# Patient Record
Sex: Male | Born: 1950 | Race: White | Hispanic: No
Health system: Southern US, Community
[De-identification: ages and names within clinical notes are randomized; demographics above are authoritative.]

---

## 2004-05-22 ENCOUNTER — Other Ambulatory Visit: Payer: Self-pay

## 2004-12-12 ENCOUNTER — Inpatient Hospital Stay: Payer: Self-pay | Admitting: Internal Medicine

## 2005-01-04 ENCOUNTER — Ambulatory Visit: Payer: Self-pay | Admitting: Internal Medicine

## 2005-01-18 ENCOUNTER — Ambulatory Visit: Payer: Self-pay | Admitting: Internal Medicine

## 2005-04-18 ENCOUNTER — Emergency Department: Payer: Self-pay | Admitting: Emergency Medicine

## 2005-09-27 ENCOUNTER — Inpatient Hospital Stay: Payer: Self-pay | Admitting: Internal Medicine

## 2006-06-05 ENCOUNTER — Emergency Department: Payer: Self-pay | Admitting: General Practice

## 2006-09-26 ENCOUNTER — Ambulatory Visit: Payer: Self-pay | Admitting: Urology

## 2007-08-21 ENCOUNTER — Ambulatory Visit: Payer: Self-pay | Admitting: Urology

## 2007-08-28 ENCOUNTER — Emergency Department: Payer: Self-pay | Admitting: Emergency Medicine

## 2007-09-23 ENCOUNTER — Other Ambulatory Visit: Payer: Self-pay

## 2007-09-23 ENCOUNTER — Emergency Department: Payer: Self-pay | Admitting: Emergency Medicine

## 2007-09-24 ENCOUNTER — Other Ambulatory Visit: Payer: Self-pay

## 2007-11-24 ENCOUNTER — Ambulatory Visit: Payer: Self-pay | Admitting: Cardiovascular Disease

## 2012-01-04 ENCOUNTER — Inpatient Hospital Stay: Payer: Self-pay | Admitting: Student

## 2012-01-04 LAB — COMPREHENSIVE METABOLIC PANEL
Albumin: 3.4 g/dL (ref 3.4–5.0)
Alkaline Phosphatase: 120 U/L (ref 50–136)
BUN: 21 mg/dL — ABNORMAL HIGH (ref 7–18)
Bilirubin,Total: 0.3 mg/dL (ref 0.2–1.0)
EGFR (African American): 46 — ABNORMAL LOW
EGFR (Non-African Amer.): 39 — ABNORMAL LOW
Osmolality: 286 (ref 275–301)
SGPT (ALT): 12 U/L
Sodium: 139 mmol/L (ref 136–145)
Total Protein: 7.7 g/dL (ref 6.4–8.2)

## 2012-01-04 LAB — CBC
HGB: 11.8 g/dL — ABNORMAL LOW (ref 13.0–18.0)
MCV: 96 fL (ref 80–100)
Platelet: 242 10*3/uL (ref 150–440)
RBC: 3.72 10*6/uL — ABNORMAL LOW (ref 4.40–5.90)
RDW: 12.9 % (ref 11.5–14.5)
WBC: 9.2 10*3/uL (ref 3.8–10.6)

## 2012-01-04 LAB — URINALYSIS, COMPLETE
Glucose,UR: NEGATIVE mg/dL (ref 0–75)
Protein: 30
RBC,UR: 164 /HPF (ref 0–5)
Specific Gravity: 1.009 (ref 1.003–1.030)
Squamous Epithelial: NONE SEEN

## 2012-01-05 LAB — CBC WITH DIFFERENTIAL/PLATELET
Basophil %: 0.4 %
Eosinophil #: 0.2 10*3/uL (ref 0.0–0.7)
HCT: 34.6 % — ABNORMAL LOW (ref 40.0–52.0)
HGB: 11.3 g/dL — ABNORMAL LOW (ref 13.0–18.0)
Lymphocyte #: 2.2 10*3/uL (ref 1.0–3.6)
MCH: 31.2 pg (ref 26.0–34.0)
Monocyte %: 7.3 %
Platelet: 214 10*3/uL (ref 150–440)
RDW: 12.8 % (ref 11.5–14.5)
WBC: 17.8 10*3/uL — ABNORMAL HIGH (ref 3.8–10.6)

## 2012-01-05 LAB — BASIC METABOLIC PANEL
Anion Gap: 12 (ref 7–16)
Calcium, Total: 8.7 mg/dL (ref 8.5–10.1)
Chloride: 106 mmol/L (ref 98–107)
Creatinine: 1.77 mg/dL — ABNORMAL HIGH (ref 0.60–1.30)
EGFR (African American): 47 — ABNORMAL LOW
EGFR (Non-African Amer.): 41 — ABNORMAL LOW
Glucose: 204 mg/dL — ABNORMAL HIGH (ref 65–99)
Potassium: 4.9 mmol/L (ref 3.5–5.1)

## 2012-01-05 LAB — TROPONIN I
Troponin-I: <0.02 ng/mL
Troponin-I: <0.02 ng/mL

## 2012-01-05 LAB — CK TOTAL AND CKMB (NOT AT ARMC)
CK, Total: 134 U/L (ref 35–232)
CK, Total: 580 U/L — ABNORMAL HIGH (ref 35–232)
CK-MB: 3.9 ng/mL — ABNORMAL HIGH (ref 0.5–3.6)
CK-MB: 3 ng/mL (ref 0.5–3.6)

## 2012-01-05 LAB — MAGNESIUM: Magnesium: 1.6 mg/dL — ABNORMAL LOW

## 2012-01-06 LAB — CBC WITH DIFFERENTIAL/PLATELET
Basophil %: 0.6 %
Eosinophil %: 7.6 %
HCT: 31.7 % — ABNORMAL LOW (ref 40.0–52.0)
HGB: 10.5 g/dL — ABNORMAL LOW (ref 13.0–18.0)
Lymphocyte %: 25.2 %
MCH: 31.2 pg (ref 26.0–34.0)
MCV: 94 fL (ref 80–100)
Monocyte %: 8.8 %
Neutrophil %: 57.8 %
Platelet: 242 10*3/uL (ref 150–440)
RBC: 3.38 10*6/uL — ABNORMAL LOW (ref 4.40–5.90)
RDW: 12.9 % (ref 11.5–14.5)
WBC: 10.9 10*3/uL — ABNORMAL HIGH (ref 3.8–10.6)

## 2012-01-06 LAB — BASIC METABOLIC PANEL
Anion Gap: 11 (ref 7–16)
BUN: 15 mg/dL (ref 7–18)
Calcium, Total: 8.2 mg/dL — ABNORMAL LOW (ref 8.5–10.1)
Chloride: 108 mmol/L — ABNORMAL HIGH (ref 98–107)
Creatinine: 1.31 mg/dL — ABNORMAL HIGH (ref 0.60–1.30)
EGFR (African American): >60
Glucose: 153 mg/dL — ABNORMAL HIGH (ref 65–99)
Osmolality: 283 (ref 275–301)
Potassium: 4.2 mmol/L (ref 3.5–5.1)
Sodium: 140 mmol/L (ref 136–145)

## 2012-01-06 LAB — CK: CK, Total: 417 U/L — ABNORMAL HIGH (ref 35–232)

## 2012-01-07 LAB — URINE CULTURE

## 2012-01-08 LAB — BASIC METABOLIC PANEL
BUN: 14 mg/dL (ref 7–18)
Chloride: 111 mmol/L — ABNORMAL HIGH (ref 98–107)
Co2: 25 mmol/L (ref 21–32)
Creatinine: 1.36 mg/dL — ABNORMAL HIGH (ref 0.60–1.30)
EGFR (Non-African Amer.): 56 — ABNORMAL LOW
Glucose: 126 mg/dL — ABNORMAL HIGH (ref 65–99)
Osmolality: 293 (ref 275–301)
Sodium: 146 mmol/L — ABNORMAL HIGH (ref 136–145)

## 2012-01-10 LAB — CULTURE, BLOOD (SINGLE)

## 2012-02-28 ENCOUNTER — Emergency Department: Payer: Self-pay | Admitting: Emergency Medicine

## 2012-02-28 LAB — COMPREHENSIVE METABOLIC PANEL
Alkaline Phosphatase: 149 U/L — ABNORMAL HIGH (ref 50–136)
Anion Gap: 10 (ref 7–16)
Calcium, Total: 9.3 mg/dL (ref 8.5–10.1)
Chloride: 101 mmol/L (ref 98–107)
Co2: 24 mmol/L (ref 21–32)
Creatinine: 1.49 mg/dL — ABNORMAL HIGH (ref 0.60–1.30)
EGFR (African American): 58 — ABNORMAL LOW
Osmolality: 284 (ref 275–301)
Potassium: 4.3 mmol/L (ref 3.5–5.1)
SGOT(AST): 14 U/L — ABNORMAL LOW (ref 15–37)
SGPT (ALT): 15 U/L
Sodium: 135 mmol/L — ABNORMAL LOW (ref 136–145)
Total Protein: 8.6 g/dL — ABNORMAL HIGH (ref 6.4–8.2)

## 2012-02-28 LAB — CBC
HGB: 12 g/dL — ABNORMAL LOW (ref 13.0–18.0)
MCH: 31.1 pg (ref 26.0–34.0)
MCV: 94 fL (ref 80–100)
Platelet: 295 10*3/uL (ref 150–440)
RDW: 13.2 % (ref 11.5–14.5)

## 2012-06-12 ENCOUNTER — Inpatient Hospital Stay: Payer: Self-pay | Admitting: Internal Medicine

## 2012-06-12 LAB — URINALYSIS, COMPLETE
Leukocyte Esterase: NEGATIVE
Nitrite: NEGATIVE
Ph: 6 (ref 4.5–8.0)
Protein: 30
RBC,UR: 267 /HPF (ref 0–5)
Specific Gravity: 1.015 (ref 1.003–1.030)
Squamous Epithelial: NONE SEEN

## 2012-06-12 LAB — CBC
HCT: 35.1 % — ABNORMAL LOW (ref 40.0–52.0)
HGB: 11.9 g/dL — ABNORMAL LOW (ref 13.0–18.0)
MCH: 30.8 pg (ref 26.0–34.0)
MCV: 91 fL (ref 80–100)
Platelet: 234 10*3/uL (ref 150–440)
RBC: 3.86 10*6/uL — ABNORMAL LOW (ref 4.40–5.90)
RDW: 12.7 % (ref 11.5–14.5)
WBC: 15.3 10*3/uL — ABNORMAL HIGH (ref 3.8–10.6)

## 2012-06-12 LAB — COMPREHENSIVE METABOLIC PANEL
Alkaline Phosphatase: 141 U/L — ABNORMAL HIGH (ref 50–136)
Anion Gap: 12 (ref 7–16)
Bilirubin,Total: 0.5 mg/dL (ref 0.2–1.0)
Calcium, Total: 8.7 mg/dL (ref 8.5–10.1)
Co2: 22 mmol/L (ref 21–32)
Creatinine: 2.14 mg/dL — ABNORMAL HIGH (ref 0.60–1.30)
EGFR (Non-African Amer.): 32 — ABNORMAL LOW
Glucose: 512 mg/dL (ref 65–99)
Osmolality: 306 (ref 275–301)
Potassium: 4.3 mmol/L (ref 3.5–5.1)
SGPT (ALT): 13 U/L (ref 12–78)
Sodium: 138 mmol/L (ref 136–145)
Total Protein: 7.5 g/dL (ref 6.4–8.2)

## 2012-06-12 LAB — SEDIMENTATION RATE: Erythrocyte Sed Rate: 65 mm/hr — ABNORMAL HIGH (ref 0–20)

## 2012-06-13 LAB — CBC WITH DIFFERENTIAL/PLATELET
Basophil #: 0.1 10*3/uL (ref 0.0–0.1)
Basophil %: 0.8 %
Eosinophil #: 0.1 10*3/uL (ref 0.0–0.7)
Eosinophil %: 0.5 %
HCT: 28.5 % — ABNORMAL LOW (ref 40.0–52.0)
HGB: 10 g/dL — ABNORMAL LOW (ref 13.0–18.0)
Lymphocyte #: 0.4 10*3/uL — ABNORMAL LOW (ref 1.0–3.6)
Lymphocyte %: 4.1 %
MCHC: 35.1 g/dL (ref 32.0–36.0)
MCV: 89 fL (ref 80–100)
Neutrophil #: 8.8 10*3/uL — ABNORMAL HIGH (ref 1.4–6.5)
Neutrophil %: 90.4 %
RDW: 12.7 % (ref 11.5–14.5)
WBC: 9.8 10*3/uL (ref 3.8–10.6)

## 2012-06-13 LAB — BASIC METABOLIC PANEL
Anion Gap: 9 (ref 7–16)
Calcium, Total: 7.6 mg/dL — ABNORMAL LOW (ref 8.5–10.1)
Creatinine: 1.85 mg/dL — ABNORMAL HIGH (ref 0.60–1.30)
EGFR (African American): 45 — ABNORMAL LOW
EGFR (Non-African Amer.): 39 — ABNORMAL LOW
Glucose: 93 mg/dL (ref 65–99)
Potassium: 3.9 mmol/L (ref 3.5–5.1)
Sodium: 138 mmol/L (ref 136–145)

## 2012-06-13 LAB — URINE CULTURE

## 2012-06-14 LAB — CBC WITH DIFFERENTIAL/PLATELET
Basophil #: 0.1 10*3/uL (ref 0.0–0.1)
Basophil %: 0.9 %
Eosinophil #: 0.6 10*3/uL (ref 0.0–0.7)
Eosinophil %: 9.8 %
HGB: 9.6 g/dL — ABNORMAL LOW (ref 13.0–18.0)
MCH: 30.4 pg (ref 26.0–34.0)
MCV: 90 fL (ref 80–100)
Monocyte #: 0.5 x10 3/mm (ref 0.2–1.0)
Monocyte %: 8.3 %
Neutrophil #: 3.6 10*3/uL (ref 1.4–6.5)
Neutrophil %: 57.7 %
RBC: 3.16 10*6/uL — ABNORMAL LOW (ref 4.40–5.90)

## 2012-06-15 LAB — CREATININE, SERUM
Creatinine: 1.99 mg/dL — ABNORMAL HIGH (ref 0.60–1.30)
EGFR (African American): 41 — ABNORMAL LOW
EGFR (Non-African Amer.): 35 — ABNORMAL LOW

## 2012-06-15 LAB — WBC: WBC: 6.8 10*3/uL (ref 3.8–10.6)

## 2012-06-16 LAB — CULTURE, BLOOD (SINGLE)

## 2012-06-16 LAB — VANCOMYCIN, TROUGH: Vancomycin, Trough: 26 ug/mL (ref 10–20)

## 2012-06-19 LAB — PATHOLOGY REPORT

## 2012-06-20 LAB — CULTURE, BLOOD (SINGLE)

## 2012-11-19 ENCOUNTER — Ambulatory Visit: Payer: Self-pay | Admitting: Family Medicine

## 2012-11-19 LAB — URINALYSIS, COMPLETE
Bilirubin,UR: NEGATIVE
Specific Gravity: 1.01 (ref 1.003–1.030)

## 2012-11-26 LAB — URINE CULTURE

## 2013-01-14 ENCOUNTER — Inpatient Hospital Stay: Payer: Self-pay | Admitting: Student

## 2013-01-14 LAB — CBC
HCT: 37.4 % — ABNORMAL LOW (ref 40.0–52.0)
HGB: 12.9 g/dL — ABNORMAL LOW (ref 13.0–18.0)
MCHC: 34.6 g/dL (ref 32.0–36.0)
Platelet: 277 10*3/uL (ref 150–440)
RBC: 4.18 10*6/uL — ABNORMAL LOW (ref 4.40–5.90)
RDW: 12.5 % (ref 11.5–14.5)
WBC: 12 10*3/uL — ABNORMAL HIGH (ref 3.8–10.6)

## 2013-01-14 LAB — COMPREHENSIVE METABOLIC PANEL
Alkaline Phosphatase: 170 U/L — ABNORMAL HIGH (ref 50–136)
Anion Gap: 9 (ref 7–16)
BUN: 27 mg/dL — ABNORMAL HIGH (ref 7–18)
Calcium, Total: 8.9 mg/dL (ref 8.5–10.1)
Creatinine: 2.08 mg/dL — ABNORMAL HIGH (ref 0.60–1.30)
Glucose: 357 mg/dL — ABNORMAL HIGH (ref 65–99)
SGOT(AST): 12 U/L — ABNORMAL LOW (ref 15–37)
Sodium: 131 mmol/L — ABNORMAL LOW (ref 136–145)
Total Protein: 8.1 g/dL (ref 6.4–8.2)

## 2013-01-14 LAB — URINALYSIS, COMPLETE
Bilirubin,UR: NEGATIVE
Glucose,UR: >=500 mg/dL (ref 0–75)
Nitrite: POSITIVE
Protein: 30
RBC,UR: 12 /HPF (ref 0–5)
Specific Gravity: 1.016 (ref 1.003–1.030)
Squamous Epithelial: NONE SEEN
WBC UR: 64 /HPF (ref 0–5)

## 2013-01-14 LAB — HEMOGLOBIN A1C: Hemoglobin A1C: 11.5 % — ABNORMAL HIGH (ref 4.2–6.3)

## 2013-01-14 LAB — LIPASE, BLOOD: Lipase: 55 U/L — ABNORMAL LOW (ref 73–393)

## 2013-01-15 LAB — PHOSPHORUS: Phosphorus: 1.9 mg/dL — ABNORMAL LOW (ref 2.5–4.9)

## 2013-01-15 LAB — BASIC METABOLIC PANEL
Anion Gap: 8 (ref 7–16)
BUN: 26 mg/dL — ABNORMAL HIGH (ref 7–18)
Calcium, Total: 8.3 mg/dL — ABNORMAL LOW (ref 8.5–10.1)
Chloride: 102 mmol/L (ref 98–107)
Creatinine: 1.83 mg/dL — ABNORMAL HIGH (ref 0.60–1.30)
Potassium: 3.3 mmol/L — ABNORMAL LOW (ref 3.5–5.1)

## 2013-01-15 LAB — IRON AND TIBC
Iron Bind.Cap.(Total): 239 ug/dL — ABNORMAL LOW (ref 250–450)
Iron Saturation: 15 %
Iron: 36 ug/dL — ABNORMAL LOW (ref 65–175)
Unbound Iron-Bind.Cap.: 203 ug/dL

## 2013-01-15 LAB — CBC WITH DIFFERENTIAL/PLATELET
Basophil #: 0.1 10*3/uL (ref 0.0–0.1)
Basophil %: 0.9 %
Eosinophil #: 0.4 10*3/uL (ref 0.0–0.7)
Eosinophil %: 4.2 %
MCHC: 36 g/dL (ref 32.0–36.0)
Monocyte #: 0.8 x10 3/mm (ref 0.2–1.0)
Monocyte %: 8.8 %
Neutrophil %: 58.8 %
Platelet: 239 10*3/uL (ref 150–440)
RBC: 3.7 10*6/uL — ABNORMAL LOW (ref 4.40–5.90)
RDW: 12.2 % (ref 11.5–14.5)
WBC: 9.3 10*3/uL (ref 3.8–10.6)

## 2013-01-15 LAB — PROTEIN / CREATININE RATIO, URINE
Creatinine, Urine: 127.3 mg/dL — ABNORMAL HIGH (ref 30.0–125.0)
Protein/Creat. Ratio: 958 mg/gCREAT — ABNORMAL HIGH (ref 0–200)

## 2013-01-15 LAB — FERRITIN: Ferritin (ARMC): 85 ng/mL (ref 8–388)

## 2013-01-16 LAB — BASIC METABOLIC PANEL
BUN: 19 mg/dL — ABNORMAL HIGH (ref 7–18)
Calcium, Total: 8.1 mg/dL — ABNORMAL LOW (ref 8.5–10.1)
Co2: 31 mmol/L (ref 21–32)
Creatinine: 1.81 mg/dL — ABNORMAL HIGH (ref 0.60–1.30)
EGFR (African American): 46 — ABNORMAL LOW
EGFR (Non-African Amer.): 39 — ABNORMAL LOW
Glucose: 139 mg/dL — ABNORMAL HIGH (ref 65–99)
Osmolality: 275 (ref 275–301)

## 2013-01-17 LAB — BASIC METABOLIC PANEL
Chloride: 106 mmol/L (ref 98–107)
Co2: 28 mmol/L (ref 21–32)
EGFR (African American): 59 — ABNORMAL LOW
EGFR (Non-African Amer.): 51 — ABNORMAL LOW
Glucose: 78 mg/dL (ref 65–99)
Potassium: 4.2 mmol/L (ref 3.5–5.1)

## 2013-01-18 LAB — BASIC METABOLIC PANEL
Anion Gap: 3 — ABNORMAL LOW (ref 7–16)
BUN: 18 mg/dL (ref 7–18)
Calcium, Total: 8.4 mg/dL — ABNORMAL LOW (ref 8.5–10.1)
Chloride: 106 mmol/L (ref 98–107)
Co2: 30 mmol/L (ref 21–32)
Creatinine: 1.61 mg/dL — ABNORMAL HIGH (ref 0.60–1.30)
EGFR (African American): 53 — ABNORMAL LOW
Glucose: 244 mg/dL — ABNORMAL HIGH (ref 65–99)
Osmolality: 288 (ref 275–301)
Potassium: 4.3 mmol/L (ref 3.5–5.1)
Sodium: 139 mmol/L (ref 136–145)

## 2013-01-18 LAB — PATHOLOGY REPORT

## 2013-01-19 ENCOUNTER — Ambulatory Visit: Payer: Self-pay | Admitting: Family Medicine

## 2013-01-19 LAB — CBC WITH DIFFERENTIAL/PLATELET
Basophil #: 0.1 10*3/uL (ref 0.0–0.1)
Basophil %: 0.9 %
Eosinophil %: 5.3 %
HCT: 35.6 % — ABNORMAL LOW (ref 40.0–52.0)
HGB: 12 g/dL — ABNORMAL LOW (ref 13.0–18.0)
Lymphocyte #: 2.1 10*3/uL (ref 1.0–3.6)
Monocyte %: 5.8 %
Neutrophil %: 65.3 %
Platelet: 284 10*3/uL (ref 150–440)
RBC: 3.94 10*6/uL — ABNORMAL LOW (ref 4.40–5.90)
WBC: 9.3 10*3/uL (ref 3.8–10.6)

## 2013-01-19 LAB — BASIC METABOLIC PANEL
BUN: 15 mg/dL (ref 7–18)
Chloride: 97 mmol/L — ABNORMAL LOW (ref 98–107)
Co2: 27 mmol/L (ref 21–32)
EGFR (African American): 44 — ABNORMAL LOW
EGFR (Non-African Amer.): 38 — ABNORMAL LOW
Potassium: 4.9 mmol/L (ref 3.5–5.1)

## 2013-01-20 LAB — CULTURE, BLOOD (SINGLE)

## 2013-03-21 ENCOUNTER — Ambulatory Visit: Payer: Self-pay | Admitting: Family Medicine

## 2013-03-21 ENCOUNTER — Inpatient Hospital Stay: Payer: Self-pay | Admitting: Internal Medicine

## 2013-03-21 LAB — CBC WITH DIFFERENTIAL/PLATELET
Basophil #: 0.1 10*3/uL (ref 0.0–0.1)
Eosinophil #: 0.1 10*3/uL (ref 0.0–0.7)
Eosinophil %: 0.9 %
HGB: 12.7 g/dL — ABNORMAL LOW (ref 13.0–18.0)
MCH: 29.9 pg (ref 26.0–34.0)
MCHC: 33.8 g/dL (ref 32.0–36.0)
MCV: 89 fL (ref 80–100)
Monocyte #: 0.9 x10 3/mm (ref 0.2–1.0)
Neutrophil #: 10.7 10*3/uL — ABNORMAL HIGH (ref 1.4–6.5)
Neutrophil %: 76.3 %
Platelet: 231 10*3/uL (ref 150–440)
RBC: 4.26 10*6/uL — ABNORMAL LOW (ref 4.40–5.90)
RDW: 13.1 % (ref 11.5–14.5)

## 2013-03-21 LAB — URINALYSIS, COMPLETE
Glucose,UR: 1000 mg/dL (ref 0–75)
Nitrite: POSITIVE
Ph: 7 (ref 4.5–8.0)
Protein: 30

## 2013-03-21 LAB — COMPREHENSIVE METABOLIC PANEL
Albumin: 3.1 g/dL — ABNORMAL LOW (ref 3.4–5.0)
Alkaline Phosphatase: 174 U/L — ABNORMAL HIGH (ref 50–136)
Anion Gap: 7 (ref 7–16)
BUN: 17 mg/dL (ref 7–18)
Calcium, Total: 9 mg/dL (ref 8.5–10.1)
Chloride: 102 mmol/L (ref 98–107)
Creatinine: 2.01 mg/dL — ABNORMAL HIGH (ref 0.60–1.30)
EGFR (Non-African Amer.): 35 — ABNORMAL LOW
Osmolality: 283 (ref 275–301)
SGOT(AST): 10 U/L — ABNORMAL LOW (ref 15–37)
SGPT (ALT): 13 U/L (ref 12–78)
Sodium: 137 mmol/L (ref 136–145)
Total Protein: 7.8 g/dL (ref 6.4–8.2)

## 2013-03-22 LAB — CBC WITH DIFFERENTIAL/PLATELET
Basophil #: 0 10*3/uL (ref 0.0–0.1)
Basophil %: 0.3 %
Eosinophil #: 0 10*3/uL (ref 0.0–0.7)
Eosinophil %: 0.3 %
HCT: 41 % (ref 40.0–52.0)
Lymphocyte #: 1.4 10*3/uL (ref 1.0–3.6)
Lymphocyte %: 9.9 %
MCHC: 33.4 g/dL (ref 32.0–36.0)
MCV: 91 fL (ref 80–100)
Monocyte %: 5.5 %
Neutrophil #: 12.1 10*3/uL — ABNORMAL HIGH (ref 1.4–6.5)
Neutrophil %: 84 %
Platelet: 279 10*3/uL (ref 150–440)
RBC: 4.53 10*6/uL (ref 4.40–5.90)
RDW: 13.1 % (ref 11.5–14.5)
WBC: 14.5 10*3/uL — ABNORMAL HIGH (ref 3.8–10.6)

## 2013-03-22 LAB — BASIC METABOLIC PANEL
Anion Gap: 16 (ref 7–16)
BUN: 21 mg/dL — ABNORMAL HIGH (ref 7–18)
Calcium, Total: 9.6 mg/dL (ref 8.5–10.1)
Creatinine: 2.39 mg/dL — ABNORMAL HIGH (ref 0.60–1.30)
EGFR (Non-African Amer.): 28 — ABNORMAL LOW
Osmolality: 286 (ref 275–301)
Sodium: 140 mmol/L (ref 136–145)

## 2013-03-22 LAB — MAGNESIUM: Magnesium: 1.6 mg/dL — ABNORMAL LOW

## 2013-03-23 LAB — BASIC METABOLIC PANEL
BUN: 24 mg/dL — ABNORMAL HIGH (ref 7–18)
Creatinine: 2.17 mg/dL — ABNORMAL HIGH (ref 0.60–1.30)
Glucose: 104 mg/dL — ABNORMAL HIGH (ref 65–99)

## 2013-03-23 LAB — URINE CULTURE

## 2013-03-27 LAB — CBC WITH DIFFERENTIAL/PLATELET
Basophil #: 0.1 10*3/uL (ref 0.0–0.1)
Eosinophil %: 8.6 %
HCT: 32.4 % — ABNORMAL LOW (ref 40.0–52.0)
Lymphocyte #: 1.9 10*3/uL (ref 1.0–3.6)
Lymphocyte %: 21.4 %
MCH: 31.1 pg (ref 26.0–34.0)
MCHC: 34.7 g/dL (ref 32.0–36.0)
Monocyte #: 0.9 x10 3/mm (ref 0.2–1.0)
Monocyte %: 10.6 %
Neutrophil #: 5.2 10*3/uL (ref 1.4–6.5)
Neutrophil %: 58.5 %
Platelet: 255 10*3/uL (ref 150–440)
RDW: 12.5 % (ref 11.5–14.5)
WBC: 8.8 10*3/uL (ref 3.8–10.6)

## 2013-03-27 LAB — BASIC METABOLIC PANEL
BUN: 17 mg/dL (ref 7–18)
Chloride: 104 mmol/L (ref 98–107)
Creatinine: 1.46 mg/dL — ABNORMAL HIGH (ref 0.60–1.30)
EGFR (African American): 59 — ABNORMAL LOW
Osmolality: 280 (ref 275–301)
Potassium: 4 mmol/L (ref 3.5–5.1)

## 2013-03-27 LAB — HEMOGLOBIN A1C: Hemoglobin A1C: 10.1 % — ABNORMAL HIGH (ref 4.2–6.3)

## 2013-04-05 ENCOUNTER — Observation Stay: Payer: Self-pay | Admitting: Internal Medicine

## 2013-04-05 LAB — COMPREHENSIVE METABOLIC PANEL
Albumin: 3.2 g/dL — ABNORMAL LOW (ref 3.4–5.0)
Alkaline Phosphatase: 129 U/L (ref 50–136)
Bilirubin,Total: 0.3 mg/dL (ref 0.2–1.0)
Calcium, Total: 8.9 mg/dL (ref 8.5–10.1)
Chloride: 98 mmol/L (ref 98–107)
Co2: 32 mmol/L (ref 21–32)
Creatinine: 1.86 mg/dL — ABNORMAL HIGH (ref 0.60–1.30)
EGFR (Non-African Amer.): 38 — ABNORMAL LOW
Glucose: 34 mg/dL — CL (ref 65–99)
Osmolality: 270 (ref 275–301)
SGOT(AST): 10 U/L — ABNORMAL LOW (ref 15–37)
SGPT (ALT): 12 U/L (ref 12–78)
Sodium: 134 mmol/L — ABNORMAL LOW (ref 136–145)
Total Protein: 8 g/dL (ref 6.4–8.2)

## 2013-04-05 LAB — URINALYSIS, COMPLETE
Bilirubin,UR: NEGATIVE
Glucose,UR: >=500 mg/dL (ref 0–75)
Ketone: NEGATIVE
Ph: 6 (ref 4.5–8.0)
RBC,UR: 3 /HPF (ref 0–5)
Specific Gravity: 1.008 (ref 1.003–1.030)
Squamous Epithelial: NONE SEEN

## 2013-04-05 LAB — CBC
HCT: 36 % — ABNORMAL LOW (ref 40.0–52.0)
Platelet: 419 10*3/uL (ref 150–440)
RBC: 4.06 10*6/uL — ABNORMAL LOW (ref 4.40–5.90)
RDW: 12.8 % (ref 11.5–14.5)

## 2013-04-06 LAB — COMPREHENSIVE METABOLIC PANEL
Albumin: 2.8 g/dL — ABNORMAL LOW (ref 3.4–5.0)
Alkaline Phosphatase: 139 U/L — ABNORMAL HIGH (ref 50–136)
Anion Gap: 7 (ref 7–16)
Bilirubin,Total: 0.3 mg/dL (ref 0.2–1.0)
Chloride: 99 mmol/L (ref 98–107)
Creatinine: 1.68 mg/dL — ABNORMAL HIGH (ref 0.60–1.30)
Osmolality: 283 (ref 275–301)
SGOT(AST): 9 U/L — ABNORMAL LOW (ref 15–37)
SGPT (ALT): 10 U/L — ABNORMAL LOW (ref 12–78)
Total Protein: 7.3 g/dL (ref 6.4–8.2)

## 2013-04-06 LAB — CBC WITH DIFFERENTIAL/PLATELET
Eosinophil #: 0.5 10*3/uL (ref 0.0–0.7)
Eosinophil %: 4.4 %
HCT: 34 % — ABNORMAL LOW (ref 40.0–52.0)
Lymphocyte %: 22.4 %
Neutrophil #: 7.9 10*3/uL — ABNORMAL HIGH (ref 1.4–6.5)
Platelet: 371 10*3/uL (ref 150–440)
RBC: 3.83 10*6/uL — ABNORMAL LOW (ref 4.40–5.90)
WBC: 11.8 10*3/uL — ABNORMAL HIGH (ref 3.8–10.6)

## 2013-04-06 LAB — MAGNESIUM: Magnesium: 2 mg/dL

## 2013-04-07 LAB — WBC: WBC: 7.1 10*3/uL (ref 3.8–10.6)

## 2013-04-10 LAB — CULTURE, BLOOD (SINGLE)

## 2013-04-21 ENCOUNTER — Emergency Department: Payer: Self-pay | Admitting: Emergency Medicine

## 2013-04-21 LAB — URINALYSIS, COMPLETE
Glucose,UR: 50 mg/dL (ref 0–75)
Hyaline Cast: 4
Ketone: NEGATIVE
Nitrite: NEGATIVE
Ph: 5 (ref 4.5–8.0)
RBC,UR: 39 /HPF (ref 0–5)
Squamous Epithelial: <1
WBC UR: 75 /HPF (ref 0–5)

## 2013-04-21 LAB — TSH: Thyroid Stimulating Horm: 2.27 u[IU]/mL

## 2013-04-21 LAB — COMPREHENSIVE METABOLIC PANEL
Albumin: 3 g/dL — ABNORMAL LOW (ref 3.4–5.0)
Alkaline Phosphatase: 134 U/L (ref 50–136)
Anion Gap: 8 (ref 7–16)
Bilirubin,Total: 0.3 mg/dL (ref 0.2–1.0)
Chloride: 102 mmol/L (ref 98–107)
Creatinine: 1.84 mg/dL — ABNORMAL HIGH (ref 0.60–1.30)
EGFR (African American): 45 — ABNORMAL LOW
EGFR (Non-African Amer.): 39 — ABNORMAL LOW
SGOT(AST): 15 U/L (ref 15–37)
SGPT (ALT): 14 U/L (ref 12–78)
Sodium: 137 mmol/L (ref 136–145)
Total Protein: 8 g/dL (ref 6.4–8.2)

## 2013-04-21 LAB — CBC
HCT: 35.5 % — ABNORMAL LOW (ref 40.0–52.0)
HGB: 12.2 g/dL — ABNORMAL LOW (ref 13.0–18.0)
MCHC: 34.4 g/dL (ref 32.0–36.0)
RBC: 3.99 10*6/uL — ABNORMAL LOW (ref 4.40–5.90)
RDW: 13.5 % (ref 11.5–14.5)

## 2013-04-21 LAB — DRUG SCREEN, URINE
Amphetamines, Ur Screen: NEGATIVE (ref ?–1000)
Barbiturates, Ur Screen: NEGATIVE (ref ?–200)
Cannabinoid 50 Ng, Ur ~~LOC~~: NEGATIVE (ref ?–50)
Cocaine Metabolite,Ur ~~LOC~~: NEGATIVE (ref ?–300)
Opiate, Ur Screen: NEGATIVE (ref ?–300)
Tricyclic, Ur Screen: NEGATIVE (ref ?–1000)

## 2013-04-21 LAB — ETHANOL: Ethanol: <3 mg/dL

## 2013-04-26 ENCOUNTER — Emergency Department: Payer: Self-pay | Admitting: Emergency Medicine

## 2013-04-26 LAB — URINALYSIS, COMPLETE
Nitrite: NEGATIVE
Ph: 5 (ref 4.5–8.0)

## 2013-05-02 ENCOUNTER — Inpatient Hospital Stay: Payer: Self-pay | Admitting: Family Medicine

## 2013-05-02 LAB — URINALYSIS, COMPLETE
Glucose,UR: 50 mg/dL (ref 0–75)
Nitrite: NEGATIVE
Protein: 150
RBC,UR: 35 /HPF (ref 0–5)
Squamous Epithelial: NONE SEEN

## 2013-05-02 LAB — COMPREHENSIVE METABOLIC PANEL
Alkaline Phosphatase: 126 U/L (ref 50–136)
Anion Gap: 7 (ref 7–16)
Bilirubin,Total: 0.2 mg/dL (ref 0.2–1.0)
Calcium, Total: 9.3 mg/dL (ref 8.5–10.1)
EGFR (African American): 39 — ABNORMAL LOW
EGFR (Non-African Amer.): 33 — ABNORMAL LOW
SGOT(AST): 16 U/L (ref 15–37)

## 2013-05-02 LAB — CBC
HCT: 37.1 % — ABNORMAL LOW (ref 40.0–52.0)
MCV: 89 fL (ref 80–100)
RDW: 13.1 % (ref 11.5–14.5)
WBC: 12.4 10*3/uL — ABNORMAL HIGH (ref 3.8–10.6)

## 2013-05-02 LAB — TROPONIN I: Troponin-I: <0.02 ng/mL

## 2013-05-02 LAB — LIPASE, BLOOD: Lipase: 47 U/L — ABNORMAL LOW (ref 73–393)

## 2013-05-03 LAB — CBC WITH DIFFERENTIAL/PLATELET
Basophil %: 0.9 %
Eosinophil %: 5.4 %
HCT: 29.8 % — ABNORMAL LOW (ref 40.0–52.0)
HGB: 10.2 g/dL — ABNORMAL LOW (ref 13.0–18.0)
MCH: 30.2 pg (ref 26.0–34.0)
MCHC: 34.2 g/dL (ref 32.0–36.0)
MCV: 88 fL (ref 80–100)
Neutrophil %: 54.5 %
Platelet: 263 10*3/uL (ref 150–440)

## 2013-05-03 LAB — BASIC METABOLIC PANEL
Chloride: 111 mmol/L — ABNORMAL HIGH (ref 98–107)
Co2: 27 mmol/L (ref 21–32)
EGFR (African American): 56 — ABNORMAL LOW
EGFR (Non-African Amer.): 48 — ABNORMAL LOW
Glucose: 109 mg/dL — ABNORMAL HIGH (ref 65–99)

## 2013-05-04 LAB — BASIC METABOLIC PANEL
Anion Gap: 6 — ABNORMAL LOW (ref 7–16)
BUN: 12 mg/dL (ref 7–18)
Calcium, Total: 8.7 mg/dL (ref 8.5–10.1)
Creatinine: 1.29 mg/dL (ref 0.60–1.30)
EGFR (African American): >60
EGFR (Non-African Amer.): 59 — ABNORMAL LOW

## 2013-05-04 LAB — URINE CULTURE

## 2013-05-04 LAB — CBC WITH DIFFERENTIAL/PLATELET
Eosinophil #: 0.4 10*3/uL (ref 0.0–0.7)
HGB: 11.8 g/dL — ABNORMAL LOW (ref 13.0–18.0)
Lymphocyte %: 25.1 %
MCH: 29.8 pg (ref 26.0–34.0)
Monocyte #: 0.4 x10 3/mm (ref 0.2–1.0)
Monocyte %: 7.6 %
Neutrophil #: 3.5 10*3/uL (ref 1.4–6.5)
RBC: 3.96 10*6/uL — ABNORMAL LOW (ref 4.40–5.90)
RDW: 13.1 % (ref 11.5–14.5)
WBC: 5.9 10*3/uL (ref 3.8–10.6)

## 2013-05-07 LAB — CULTURE, BLOOD (SINGLE)

## 2013-05-10 ENCOUNTER — Other Ambulatory Visit: Payer: Self-pay | Admitting: Family Medicine

## 2013-05-10 LAB — COMPREHENSIVE METABOLIC PANEL
Alkaline Phosphatase: 130 U/L (ref 50–136)
Anion Gap: 4 — ABNORMAL LOW (ref 7–16)
BUN: 18 mg/dL (ref 7–18)
Bilirubin,Total: 0.3 mg/dL (ref 0.2–1.0)
Calcium, Total: 9 mg/dL (ref 8.5–10.1)
Chloride: 106 mmol/L (ref 98–107)
EGFR (African American): 59 — ABNORMAL LOW
Glucose: 254 mg/dL — ABNORMAL HIGH (ref 65–99)
Potassium: 4.6 mmol/L (ref 3.5–5.1)
SGOT(AST): 15 U/L (ref 15–37)
SGPT (ALT): 13 U/L (ref 12–78)
Sodium: 141 mmol/L (ref 136–145)

## 2013-05-10 LAB — CBC WITH DIFFERENTIAL/PLATELET
Basophil #: 0.1 10*3/uL (ref 0.0–0.1)
Basophil %: 0.8 %
Eosinophil #: 0.4 10*3/uL (ref 0.0–0.7)
Eosinophil %: 4.2 %
HCT: 35.1 % — ABNORMAL LOW (ref 40.0–52.0)
HGB: 11.9 g/dL — ABNORMAL LOW (ref 13.0–18.0)
Lymphocyte #: 1.7 10*3/uL (ref 1.0–3.6)
Lymphocyte %: 19.2 %
MCH: 30.2 pg (ref 26.0–34.0)
MCHC: 33.8 g/dL (ref 32.0–36.0)
MCV: 90 fL (ref 80–100)
Monocyte #: 0.4 x10 3/mm (ref 0.2–1.0)
Monocyte %: 4.8 %
Neutrophil #: 6.1 10*3/uL (ref 1.4–6.5)
Neutrophil %: 71 %
Platelet: 294 10*3/uL (ref 150–440)
RBC: 3.93 10*6/uL — ABNORMAL LOW (ref 4.40–5.90)
RDW: 13.6 % (ref 11.5–14.5)
WBC: 8.6 10*3/uL (ref 3.8–10.6)

## 2013-05-10 LAB — VALPROIC ACID LEVEL: Valproic Acid: 20 ug/mL — ABNORMAL LOW

## 2013-06-22 ENCOUNTER — Other Ambulatory Visit: Payer: Self-pay | Admitting: Family Medicine

## 2013-06-22 LAB — BASIC METABOLIC PANEL
Anion Gap: 8 (ref 7–16)
Calcium, Total: 8.6 mg/dL (ref 8.5–10.1)
Co2: 26 mmol/L (ref 21–32)
EGFR (African American): 50 — ABNORMAL LOW
Glucose: 165 mg/dL — ABNORMAL HIGH (ref 65–99)
Potassium: 4.3 mmol/L (ref 3.5–5.1)
Sodium: 142 mmol/L (ref 136–145)

## 2013-06-22 LAB — CBC WITH DIFFERENTIAL/PLATELET
Basophil #: 0.1 10*3/uL (ref 0.0–0.1)
Basophil %: 0.7 %
Eosinophil #: 0.3 10*3/uL (ref 0.0–0.7)
Eosinophil %: 3.2 %
HCT: 32.6 % — ABNORMAL LOW (ref 40.0–52.0)
HGB: 11 g/dL — ABNORMAL LOW (ref 13.0–18.0)
Lymphocyte #: 2.3 10*3/uL (ref 1.0–3.6)
Lymphocyte %: 27.1 %
MCH: 30.5 pg (ref 26.0–34.0)
MCHC: 33.8 g/dL (ref 32.0–36.0)
MCV: 90 fL (ref 80–100)
Monocyte #: 0.6 x10 3/mm (ref 0.2–1.0)
Monocyte %: 6.9 %
Neutrophil #: 5.2 10*3/uL (ref 1.4–6.5)
Neutrophil %: 62.1 %
Platelet: 211 10*3/uL (ref 150–440)
RBC: 3.61 10*6/uL — ABNORMAL LOW (ref 4.40–5.90)
RDW: 15.2 % — ABNORMAL HIGH (ref 11.5–14.5)
WBC: 8.5 10*3/uL (ref 3.8–10.6)

## 2013-06-24 ENCOUNTER — Other Ambulatory Visit: Payer: Self-pay | Admitting: Family Medicine

## 2013-06-24 LAB — URINALYSIS, COMPLETE
Bilirubin,UR: NEGATIVE
Nitrite: POSITIVE
Ph: 5 (ref 4.5–8.0)
Protein: NEGATIVE
Specific Gravity: 1.014 (ref 1.003–1.030)
WBC UR: 102 /HPF (ref 0–5)

## 2013-06-26 LAB — URINE CULTURE

## 2013-10-13 ENCOUNTER — Inpatient Hospital Stay: Payer: Self-pay | Admitting: Internal Medicine

## 2013-10-13 LAB — URINALYSIS, COMPLETE
Bacteria: NONE SEEN
Bilirubin,UR: NEGATIVE
Glucose,UR: 50 mg/dL (ref 0–75)
KETONE: NEGATIVE
Nitrite: NEGATIVE
PH: 6 (ref 4.5–8.0)
Protein: 30
RBC,UR: 96 /HPF (ref 0–5)
Specific Gravity: 1.013 (ref 1.003–1.030)
Squamous Epithelial: NONE SEEN
WBC UR: 16 /HPF (ref 0–5)

## 2013-10-13 LAB — CBC
HCT: 29.7 % — ABNORMAL LOW (ref 40.0–52.0)
HGB: 9.8 g/dL — AB (ref 13.0–18.0)
MCH: 30.4 pg (ref 26.0–34.0)
MCHC: 33 g/dL (ref 32.0–36.0)
MCV: 92 fL (ref 80–100)
PLATELETS: 240 10*3/uL (ref 150–440)
RBC: 3.23 10*6/uL — AB (ref 4.40–5.90)
RDW: 14.5 % (ref 11.5–14.5)
WBC: 12.3 10*3/uL — AB (ref 3.8–10.6)

## 2013-10-13 LAB — COMPREHENSIVE METABOLIC PANEL
ALK PHOS: 91 U/L
ANION GAP: 3 — AB (ref 7–16)
Albumin: 2.7 g/dL — ABNORMAL LOW (ref 3.4–5.0)
BUN: 43 mg/dL — AB (ref 7–18)
Bilirubin,Total: 0.3 mg/dL (ref 0.2–1.0)
Calcium, Total: 8.4 mg/dL — ABNORMAL LOW (ref 8.5–10.1)
Chloride: 102 mmol/L (ref 98–107)
Co2: 30 mmol/L (ref 21–32)
Creatinine: 2.06 mg/dL — ABNORMAL HIGH (ref 0.60–1.30)
EGFR (African American): 39 — ABNORMAL LOW
EGFR (Non-African Amer.): 34 — ABNORMAL LOW
Glucose: 263 mg/dL — ABNORMAL HIGH (ref 65–99)
Osmolality: 290 (ref 275–301)
Potassium: 4.7 mmol/L (ref 3.5–5.1)
SGOT(AST): 22 U/L (ref 15–37)
SGPT (ALT): 17 U/L (ref 12–78)
Sodium: 135 mmol/L — ABNORMAL LOW (ref 136–145)
TOTAL PROTEIN: 8.4 g/dL — AB (ref 6.4–8.2)

## 2013-10-13 LAB — SYNOVIAL CELL COUNT + DIFF, W/ CRYSTALS
Basophil: 0 %
Eosinophil: 0 %
LYMPHS PCT: 8 %
Neutrophils: 86 %
Nucleated Cell Count: 45015 /mm3
Other Cells BF: 0 %
Other Mononuclear Cells: 6 %

## 2013-10-13 LAB — SEDIMENTATION RATE: ERYTHROCYTE SED RATE: 106 mm/h — AB (ref 0–20)

## 2013-10-14 LAB — CBC WITH DIFFERENTIAL/PLATELET
BASOS PCT: 0.3 %
Basophil #: 0 10*3/uL (ref 0.0–0.1)
Eosinophil #: 0 10*3/uL (ref 0.0–0.7)
Eosinophil %: 0.4 %
HCT: 27.1 % — AB (ref 40.0–52.0)
HGB: 9.2 g/dL — AB (ref 13.0–18.0)
Lymphocyte #: 0.8 10*3/uL — ABNORMAL LOW (ref 1.0–3.6)
Lymphocyte %: 7.2 %
MCH: 31.1 pg (ref 26.0–34.0)
MCHC: 34.1 g/dL (ref 32.0–36.0)
MCV: 91 fL (ref 80–100)
Monocyte #: 0.3 x10 3/mm (ref 0.2–1.0)
Monocyte %: 2.4 %
NEUTROS ABS: 10.3 10*3/uL — AB (ref 1.4–6.5)
Neutrophil %: 89.7 %
Platelet: 209 10*3/uL (ref 150–440)
RBC: 2.97 10*6/uL — ABNORMAL LOW (ref 4.40–5.90)
RDW: 14.4 % (ref 11.5–14.5)
WBC: 11.5 10*3/uL — ABNORMAL HIGH (ref 3.8–10.6)

## 2013-10-14 LAB — BASIC METABOLIC PANEL
Anion Gap: 7 (ref 7–16)
BUN: 32 mg/dL — ABNORMAL HIGH (ref 7–18)
Calcium, Total: 8.3 mg/dL — ABNORMAL LOW (ref 8.5–10.1)
Chloride: 100 mmol/L (ref 98–107)
Co2: 24 mmol/L (ref 21–32)
Creatinine: 1.58 mg/dL — ABNORMAL HIGH (ref 0.60–1.30)
EGFR (African American): 54 — ABNORMAL LOW
EGFR (Non-African Amer.): 46 — ABNORMAL LOW
Glucose: 236 mg/dL — ABNORMAL HIGH (ref 65–99)
Osmolality: 277 (ref 275–301)
POTASSIUM: 4.3 mmol/L (ref 3.5–5.1)
Sodium: 131 mmol/L — ABNORMAL LOW (ref 136–145)

## 2013-10-14 LAB — SEDIMENTATION RATE: ERYTHROCYTE SED RATE: 68 mm/h — AB (ref 0–20)

## 2013-10-14 LAB — URIC ACID: Uric Acid: 7.1 mg/dL (ref 3.5–7.2)

## 2013-10-14 LAB — MAGNESIUM: Magnesium: 2 mg/dL

## 2013-10-15 LAB — RAPID INFLUENZA A&B ANTIGENS (ARMC ONLY)

## 2013-10-16 LAB — URINE CULTURE

## 2013-10-17 LAB — BODY FLUID CULTURE

## 2013-10-18 ENCOUNTER — Emergency Department: Payer: Self-pay | Admitting: Emergency Medicine

## 2013-10-18 LAB — CULTURE, BLOOD (SINGLE)

## 2013-10-18 LAB — CBC
HCT: 31.8 % — AB (ref 40.0–52.0)
HGB: 10.7 g/dL — AB (ref 13.0–18.0)
MCH: 31.3 pg (ref 26.0–34.0)
MCHC: 33.5 g/dL (ref 32.0–36.0)
MCV: 93 fL (ref 80–100)
Platelet: 288 10*3/uL (ref 150–440)
RBC: 3.41 10*6/uL — AB (ref 4.40–5.90)
RDW: 14.6 % — ABNORMAL HIGH (ref 11.5–14.5)
WBC: 10.6 10*3/uL (ref 3.8–10.6)

## 2013-10-18 LAB — COMPREHENSIVE METABOLIC PANEL
ALT: 16 U/L (ref 12–78)
Albumin: 2.7 g/dL — ABNORMAL LOW (ref 3.4–5.0)
Alkaline Phosphatase: 109 U/L
Anion Gap: 0 — ABNORMAL LOW (ref 7–16)
BUN: 51 mg/dL — AB (ref 7–18)
Bilirubin,Total: 0.2 mg/dL (ref 0.2–1.0)
CALCIUM: 8.9 mg/dL (ref 8.5–10.1)
CO2: 32 mmol/L (ref 21–32)
CREATININE: 1.82 mg/dL — AB (ref 0.60–1.30)
Chloride: 101 mmol/L (ref 98–107)
EGFR (Non-African Amer.): 39 — ABNORMAL LOW
GFR CALC AF AMER: 45 — AB
Glucose: 411 mg/dL — ABNORMAL HIGH (ref 65–99)
Osmolality: 297 (ref 275–301)
POTASSIUM: 4.9 mmol/L (ref 3.5–5.1)
SGOT(AST): 9 U/L — ABNORMAL LOW (ref 15–37)
SODIUM: 133 mmol/L — AB (ref 136–145)
Total Protein: 8.1 g/dL (ref 6.4–8.2)

## 2013-10-19 ENCOUNTER — Inpatient Hospital Stay: Payer: Self-pay | Admitting: Internal Medicine

## 2013-10-19 LAB — COMPREHENSIVE METABOLIC PANEL
ALK PHOS: 79 U/L
ANION GAP: 1 — AB (ref 7–16)
Albumin: 2.7 g/dL — ABNORMAL LOW (ref 3.4–5.0)
BILIRUBIN TOTAL: 0.2 mg/dL (ref 0.2–1.0)
BUN: 56 mg/dL — ABNORMAL HIGH (ref 7–18)
CALCIUM: 8.7 mg/dL (ref 8.5–10.1)
Chloride: 106 mmol/L (ref 98–107)
Co2: 30 mmol/L (ref 21–32)
Creatinine: 1.8 mg/dL — ABNORMAL HIGH (ref 0.60–1.30)
EGFR (African American): 46 — ABNORMAL LOW
GFR CALC NON AF AMER: 39 — AB
Glucose: 142 mg/dL — ABNORMAL HIGH (ref 65–99)
OSMOLALITY: 292 (ref 275–301)
Potassium: 4.3 mmol/L (ref 3.5–5.1)
SGOT(AST): 10 U/L — ABNORMAL LOW (ref 15–37)
SGPT (ALT): 16 U/L (ref 12–78)
Sodium: 137 mmol/L (ref 136–145)
TOTAL PROTEIN: 7.3 g/dL (ref 6.4–8.2)

## 2013-10-19 LAB — URINALYSIS, COMPLETE
BACTERIA: NONE SEEN
SPECIFIC GRAVITY: 1.019 (ref 1.003–1.030)
Squamous Epithelial: NONE SEEN

## 2013-10-19 LAB — CBC
HCT: 26.7 % — AB (ref 40.0–52.0)
HGB: 8.8 g/dL — ABNORMAL LOW (ref 13.0–18.0)
MCH: 30.8 pg (ref 26.0–34.0)
MCHC: 33 g/dL (ref 32.0–36.0)
MCV: 93 fL (ref 80–100)
PLATELETS: 302 10*3/uL (ref 150–440)
RBC: 2.86 10*6/uL — AB (ref 4.40–5.90)
RDW: 14.1 % (ref 11.5–14.5)
WBC: 15 10*3/uL — AB (ref 3.8–10.6)

## 2013-10-19 LAB — PROTIME-INR
INR: 1.1
PROTHROMBIN TIME: 13.8 s (ref 11.5–14.7)

## 2013-10-20 ENCOUNTER — Ambulatory Visit: Payer: Self-pay | Admitting: Urology

## 2013-10-20 LAB — CBC WITH DIFFERENTIAL/PLATELET
BANDS NEUTROPHIL: 1 %
Comment - H1-Com2: NORMAL
HCT: 24.9 % — ABNORMAL LOW (ref 40.0–52.0)
HGB: 8.1 g/dL — ABNORMAL LOW (ref 13.0–18.0)
Lymphocytes: 13 %
MCH: 30.1 pg (ref 26.0–34.0)
MCHC: 32.4 g/dL (ref 32.0–36.0)
MCV: 93 fL (ref 80–100)
METAMYELOCYTE: 2 %
MYELOCYTE: 1 %
Monocytes: 4 %
PLATELETS: 271 10*3/uL (ref 150–440)
RBC: 2.67 10*6/uL — ABNORMAL LOW (ref 4.40–5.90)
RDW: 14.6 % — ABNORMAL HIGH (ref 11.5–14.5)
SEGMENTED NEUTROPHILS: 79 %
WBC: 9.6 10*3/uL (ref 3.8–10.6)

## 2013-10-20 LAB — BASIC METABOLIC PANEL
Anion Gap: 2 — ABNORMAL LOW (ref 7–16)
BUN: 38 mg/dL — ABNORMAL HIGH (ref 7–18)
CHLORIDE: 105 mmol/L (ref 98–107)
CREATININE: 1.43 mg/dL — AB (ref 0.60–1.30)
Calcium, Total: 8.1 mg/dL — ABNORMAL LOW (ref 8.5–10.1)
Co2: 30 mmol/L (ref 21–32)
EGFR (Non-African Amer.): 52 — ABNORMAL LOW
GLUCOSE: 82 mg/dL (ref 65–99)
OSMOLALITY: 282 (ref 275–301)
POTASSIUM: 5.5 mmol/L — AB (ref 3.5–5.1)
Sodium: 137 mmol/L (ref 136–145)

## 2013-10-20 LAB — HEMOGLOBIN
HGB: 8.3 g/dL — ABNORMAL LOW (ref 13.0–18.0)
HGB: 7.7 g/dL — AB (ref 13.0–18.0)

## 2013-10-20 LAB — URINE CULTURE

## 2013-10-21 LAB — CBC WITH DIFFERENTIAL/PLATELET
Basophil #: 0 x10 3/mm 3
Basophil %: 0.2 %
Eosinophil #: 0 x10 3/mm 3
Eosinophil %: 0 %
HCT: 22.1 % — ABNORMAL LOW
HGB: 7.5 g/dL — ABNORMAL LOW
Lymphocyte %: 15 %
Lymphs Abs: 1.8 x10 3/mm 3
MCH: 31.9 pg
MCHC: 34 g/dL
MCV: 94 fL
Monocyte #: 0.5 "x10 3/mm "
Monocyte %: 4.6 %
Neutrophil #: 9.5 x10 3/mm 3 — ABNORMAL HIGH
Neutrophil %: 80.2 %
Platelet: 254 x10 3/mm 3
RBC: 2.36 x10 6/mm 3 — ABNORMAL LOW
RDW: 14.9 % — ABNORMAL HIGH
WBC: 11.8 x10 3/mm 3 — ABNORMAL HIGH

## 2013-10-21 LAB — BASIC METABOLIC PANEL WITH GFR
Anion Gap: 5 — ABNORMAL LOW
BUN: 37 mg/dL — ABNORMAL HIGH
Calcium, Total: 8 mg/dL — ABNORMAL LOW
Chloride: 107 mmol/L
Co2: 26 mmol/L
Creatinine: 1.33 mg/dL — ABNORMAL HIGH
EGFR (African American): >60
EGFR (Non-African Amer.): 57 — ABNORMAL LOW
Glucose: 259 mg/dL — ABNORMAL HIGH
Osmolality: 293
Potassium: 4.7 mmol/L
Sodium: 138 mmol/L

## 2013-10-21 LAB — HEMOGLOBIN: HGB: 7 g/dL — ABNORMAL LOW (ref 13.0–18.0)

## 2013-10-22 LAB — BASIC METABOLIC PANEL
Anion Gap: 2 — ABNORMAL LOW (ref 7–16)
BUN: 34 mg/dL — ABNORMAL HIGH (ref 7–18)
CHLORIDE: 109 mmol/L — AB (ref 98–107)
CO2: 26 mmol/L (ref 21–32)
CREATININE: 1.27 mg/dL (ref 0.60–1.30)
Calcium, Total: 8.1 mg/dL — ABNORMAL LOW (ref 8.5–10.1)
EGFR (Non-African Amer.): >60
GLUCOSE: 215 mg/dL — AB (ref 65–99)
OSMOLALITY: 288 (ref 275–301)
Potassium: 4.4 mmol/L (ref 3.5–5.1)
Sodium: 137 mmol/L (ref 136–145)

## 2013-10-22 LAB — CBC WITH DIFFERENTIAL/PLATELET
BANDS NEUTROPHIL: 2 %
COMMENT - H1-COM1: NORMAL
HCT: 21.7 % — ABNORMAL LOW (ref 40.0–52.0)
HGB: 7.1 g/dL — ABNORMAL LOW (ref 13.0–18.0)
Lymphocytes: 17 %
MCH: 30.6 pg (ref 26.0–34.0)
MCHC: 32.7 g/dL (ref 32.0–36.0)
MCV: 94 fL (ref 80–100)
MONOS PCT: 4 %
Metamyelocyte: 1 %
Platelet: 259 10*3/uL (ref 150–440)
RBC: 2.32 10*6/uL — ABNORMAL LOW (ref 4.40–5.90)
RDW: 14.8 % — ABNORMAL HIGH (ref 11.5–14.5)
SEGMENTED NEUTROPHILS: 76 %
WBC: 13.9 10*3/uL — ABNORMAL HIGH (ref 3.8–10.6)

## 2013-10-23 LAB — CBC WITH DIFFERENTIAL/PLATELET
Basophil #: 0 10*3/uL (ref 0.0–0.1)
Basophil %: 0.2 %
EOS ABS: 0 10*3/uL (ref 0.0–0.7)
EOS PCT: 0.1 %
HCT: 22.5 % — ABNORMAL LOW (ref 40.0–52.0)
HGB: 7.8 g/dL — ABNORMAL LOW (ref 13.0–18.0)
Lymphocyte #: 2.5 10*3/uL (ref 1.0–3.6)
Lymphocyte %: 16.1 %
MCH: 32.7 pg (ref 26.0–34.0)
MCHC: 34.5 g/dL (ref 32.0–36.0)
MCV: 95 fL (ref 80–100)
MONOS PCT: 5.1 %
Monocyte #: 0.8 x10 3/mm (ref 0.2–1.0)
NEUTROS ABS: 12 10*3/uL — AB (ref 1.4–6.5)
NEUTROS PCT: 78.5 %
Platelet: 255 10*3/uL (ref 150–440)
RBC: 2.38 10*6/uL — ABNORMAL LOW (ref 4.40–5.90)
RDW: 15.1 % — ABNORMAL HIGH (ref 11.5–14.5)
WBC: 15.3 10*3/uL — AB (ref 3.8–10.6)

## 2013-10-26 ENCOUNTER — Inpatient Hospital Stay: Payer: Self-pay | Admitting: Internal Medicine

## 2013-10-26 LAB — TROPONIN I: TROPONIN-I: 0.65 ng/mL — AB

## 2013-10-26 LAB — COMPREHENSIVE METABOLIC PANEL
ALBUMIN: 2.8 g/dL — AB (ref 3.4–5.0)
ALK PHOS: 144 U/L — AB
Anion Gap: 9 (ref 7–16)
BILIRUBIN TOTAL: 0.3 mg/dL (ref 0.2–1.0)
BUN: 57 mg/dL — AB (ref 7–18)
Calcium, Total: 8 mg/dL — ABNORMAL LOW (ref 8.5–10.1)
Chloride: 109 mmol/L — ABNORMAL HIGH (ref 98–107)
Co2: 22 mmol/L (ref 21–32)
Creatinine: 2.42 mg/dL — ABNORMAL HIGH (ref 0.60–1.30)
GFR CALC AF AMER: 32 — AB
GFR CALC NON AF AMER: 28 — AB
GLUCOSE: 106 mg/dL — AB (ref 65–99)
Osmolality: 296 (ref 275–301)
Potassium: 5.3 mmol/L — ABNORMAL HIGH (ref 3.5–5.1)
SGOT(AST): 42 U/L — ABNORMAL HIGH (ref 15–37)
SGPT (ALT): 53 U/L (ref 12–78)
SODIUM: 140 mmol/L (ref 136–145)
Total Protein: 6.4 g/dL (ref 6.4–8.2)

## 2013-10-26 LAB — CBC WITH DIFFERENTIAL/PLATELET
Basophil #: 0.2 10*3/uL — ABNORMAL HIGH (ref 0.0–0.1)
Basophil %: 0.5 %
EOS ABS: 0.2 10*3/uL (ref 0.0–0.7)
Eosinophil %: 0.5 %
HCT: 27.2 % — AB (ref 40.0–52.0)
HGB: 9.1 g/dL — ABNORMAL LOW (ref 13.0–18.0)
Lymphocyte #: 0.4 10*3/uL — ABNORMAL LOW (ref 1.0–3.6)
Lymphocyte %: 1.3 %
MCH: 32.1 pg (ref 26.0–34.0)
MCHC: 33.4 g/dL (ref 32.0–36.0)
MCV: 96 fL (ref 80–100)
MONO ABS: 1.8 x10 3/mm — AB (ref 0.2–1.0)
MONOS PCT: 5.3 %
NEUTROS ABS: 31.1 10*3/uL — AB (ref 1.4–6.5)
Neutrophil %: 92.4 %
PLATELETS: 297 10*3/uL (ref 150–440)
RBC: 2.83 10*6/uL — AB (ref 4.40–5.90)
RDW: 16.7 % — ABNORMAL HIGH (ref 11.5–14.5)
WBC: 33.7 10*3/uL — ABNORMAL HIGH (ref 3.8–10.6)

## 2013-10-26 LAB — CK TOTAL AND CKMB (NOT AT ARMC)
CK, TOTAL: 3054 U/L — AB
CK-MB: 22.4 ng/mL — AB (ref 0.5–3.6)

## 2013-10-26 LAB — RAPID INFLUENZA A&B ANTIGENS

## 2013-10-26 LAB — CLOSTRIDIUM DIFFICILE(ARMC)

## 2013-10-27 ENCOUNTER — Ambulatory Visit: Payer: Self-pay | Admitting: Neurology

## 2013-10-27 LAB — CBC WITH DIFFERENTIAL/PLATELET
Basophil #: 0 10*3/uL (ref 0.0–0.1)
Basophil %: 0.1 %
EOS ABS: 0 10*3/uL (ref 0.0–0.7)
Eosinophil %: 0 %
HCT: 21.4 % — ABNORMAL LOW (ref 40.0–52.0)
HGB: 7.3 g/dL — AB (ref 13.0–18.0)
LYMPHS PCT: 3 %
Lymphocyte #: 0.7 10*3/uL — ABNORMAL LOW (ref 1.0–3.6)
MCH: 33 pg (ref 26.0–34.0)
MCHC: 34.2 g/dL (ref 32.0–36.0)
MCV: 97 fL (ref 80–100)
MONO ABS: 0.7 x10 3/mm (ref 0.2–1.0)
Monocyte %: 2.9 %
NEUTROS ABS: 21.9 10*3/uL — AB (ref 1.4–6.5)
Neutrophil %: 94 %
Platelet: 204 10*3/uL (ref 150–440)
RBC: 2.22 10*6/uL — AB (ref 4.40–5.90)
RDW: 16.8 % — AB (ref 11.5–14.5)
WBC: 23.3 10*3/uL — AB (ref 3.8–10.6)

## 2013-10-27 LAB — COMPREHENSIVE METABOLIC PANEL
ALBUMIN: 2 g/dL — AB (ref 3.4–5.0)
ALK PHOS: 118 U/L — AB
ALT: 57 U/L (ref 12–78)
ANION GAP: 5 — AB (ref 7–16)
AST: 94 U/L — AB (ref 15–37)
BUN: 45 mg/dL — ABNORMAL HIGH (ref 7–18)
Bilirubin,Total: 0.3 mg/dL (ref 0.2–1.0)
CREATININE: 1.71 mg/dL — AB (ref 0.60–1.30)
Calcium, Total: 7 mg/dL — CL (ref 8.5–10.1)
Chloride: 111 mmol/L — ABNORMAL HIGH (ref 98–107)
Co2: 25 mmol/L (ref 21–32)
GFR CALC AF AMER: 49 — AB
GFR CALC NON AF AMER: 42 — AB
Glucose: 96 mg/dL (ref 65–99)
OSMOLALITY: 293 (ref 275–301)
Potassium: 4.1 mmol/L (ref 3.5–5.1)
Sodium: 141 mmol/L (ref 136–145)
Total Protein: 5.2 g/dL — ABNORMAL LOW (ref 6.4–8.2)

## 2013-10-27 LAB — URINALYSIS, COMPLETE
BILIRUBIN, UR: NEGATIVE
KETONE: NEGATIVE
Leukocyte Esterase: NEGATIVE
NITRITE: NEGATIVE
Ph: 5 (ref 4.5–8.0)
Protein: 30
Specific Gravity: 1.013 (ref 1.003–1.030)

## 2013-10-27 LAB — HEMOGLOBIN: HGB: 7.3 g/dL — AB (ref 13.0–18.0)

## 2013-10-27 LAB — CK-MB: CK-MB: 19.2 ng/mL — ABNORMAL HIGH (ref 0.5–3.6)

## 2013-10-27 LAB — HEMATOCRIT: HCT: 21.6 % — AB (ref 40.0–52.0)

## 2013-10-27 LAB — CK TOTAL AND CKMB (NOT AT ARMC)
CK, TOTAL: 2370 U/L — AB
CK, Total: 2670 U/L — ABNORMAL HIGH
CK-MB: 19.5 ng/mL — ABNORMAL HIGH (ref 0.5–3.6)
CK-MB: 15.9 ng/mL — ABNORMAL HIGH (ref 0.5–3.6)

## 2013-10-27 LAB — TROPONIN I
TROPONIN-I: 0.62 ng/mL — AB
TROPONIN-I: 0.54 ng/mL — AB

## 2013-10-28 LAB — CBC WITH DIFFERENTIAL/PLATELET
Basophil #: 0.1 10*3/uL (ref 0.0–0.1)
Basophil %: 0.5 %
EOS PCT: 0 %
Eosinophil #: 0 10*3/uL (ref 0.0–0.7)
HCT: 19 % — ABNORMAL LOW (ref 40.0–52.0)
HGB: 6.3 g/dL — AB (ref 13.0–18.0)
LYMPHS ABS: 1 10*3/uL (ref 1.0–3.6)
LYMPHS PCT: 7.7 %
MCH: 31.6 pg (ref 26.0–34.0)
MCHC: 33.2 g/dL (ref 32.0–36.0)
MCV: 95 fL (ref 80–100)
Monocyte #: 0.5 x10 3/mm (ref 0.2–1.0)
Monocyte %: 3.8 %
Neutrophil #: 11.8 10*3/uL — ABNORMAL HIGH (ref 1.4–6.5)
Neutrophil %: 88 %
Platelet: 161 10*3/uL (ref 150–440)
RBC: 2 10*6/uL — ABNORMAL LOW (ref 4.40–5.90)
RDW: 16.2 % — ABNORMAL HIGH (ref 11.5–14.5)
WBC: 13.4 10*3/uL — ABNORMAL HIGH (ref 3.8–10.6)

## 2013-10-28 LAB — BASIC METABOLIC PANEL
Anion Gap: 2 — ABNORMAL LOW (ref 7–16)
BUN: 25 mg/dL — AB (ref 7–18)
CALCIUM: 6.7 mg/dL — AB (ref 8.5–10.1)
CHLORIDE: 112 mmol/L — AB (ref 98–107)
Co2: 30 mmol/L (ref 21–32)
Creatinine: 1.25 mg/dL (ref 0.60–1.30)
EGFR (African American): >60
EGFR (Non-African Amer.): >60
Glucose: 23 mg/dL — CL (ref 65–99)
Osmolality: 287 (ref 275–301)
POTASSIUM: 3 mmol/L — AB (ref 3.5–5.1)
Sodium: 144 mmol/L (ref 136–145)

## 2013-10-28 LAB — ALBUMIN: Albumin: 1.7 g/dL — ABNORMAL LOW (ref 3.4–5.0)

## 2013-10-28 LAB — MAGNESIUM: Magnesium: 1.8 mg/dL

## 2013-10-28 LAB — PROTIME-INR
INR: 1.1
PROTHROMBIN TIME: 13.6 s (ref 11.5–14.7)

## 2013-10-28 LAB — CK: CK, Total: 1654 U/L — ABNORMAL HIGH

## 2013-10-28 LAB — URINE CULTURE

## 2013-10-28 LAB — APTT: ACTIVATED PTT: 37.1 s — AB (ref 23.6–35.9)

## 2013-10-28 LAB — VANCOMYCIN, TROUGH: Vancomycin, Trough: 12 ug/mL (ref 10–20)

## 2013-10-29 DIAGNOSIS — I059 Rheumatic mitral valve disease, unspecified: Secondary | ICD-10-CM

## 2013-10-29 LAB — BASIC METABOLIC PANEL
ANION GAP: 4 — AB (ref 7–16)
BUN: 21 mg/dL — ABNORMAL HIGH (ref 7–18)
CALCIUM: 7.5 mg/dL — AB (ref 8.5–10.1)
Chloride: 103 mmol/L (ref 98–107)
Co2: 33 mmol/L — ABNORMAL HIGH (ref 21–32)
Creatinine: 1.44 mg/dL — ABNORMAL HIGH (ref 0.60–1.30)
EGFR (African American): 60 — ABNORMAL LOW
EGFR (Non-African Amer.): 52 — ABNORMAL LOW
Glucose: 193 mg/dL — ABNORMAL HIGH (ref 65–99)
Osmolality: 288 (ref 275–301)
Potassium: 3.4 mmol/L — ABNORMAL LOW (ref 3.5–5.1)
Sodium: 140 mmol/L (ref 136–145)

## 2013-10-29 LAB — CK: CK, Total: 625 U/L — ABNORMAL HIGH

## 2013-10-29 LAB — STOOL CULTURE

## 2013-10-29 LAB — OCCULT BLOOD X 1 CARD TO LAB, STOOL: Occult Blood, Feces: NEGATIVE

## 2013-10-29 LAB — RAPID HIV-1/2 QL/CONFIRM: HIV-1/2, RAPID QL: NEGATIVE

## 2013-10-30 LAB — CBC WITH DIFFERENTIAL/PLATELET
Basophil #: 0 10*3/uL (ref 0.0–0.1)
Basophil %: 0.3 %
Eosinophil #: 0 10*3/uL (ref 0.0–0.7)
Eosinophil %: 0 %
HCT: 33.3 % — AB (ref 40.0–52.0)
HGB: 11.1 g/dL — ABNORMAL LOW (ref 13.0–18.0)
Lymphocyte #: 1 10*3/uL (ref 1.0–3.6)
Lymphocyte %: 9.8 %
MCH: 30.6 pg (ref 26.0–34.0)
MCHC: 33.5 g/dL (ref 32.0–36.0)
MCV: 91 fL (ref 80–100)
MONOS PCT: 2.8 %
Monocyte #: 0.3 x10 3/mm (ref 0.2–1.0)
Neutrophil #: 8.5 10*3/uL — ABNORMAL HIGH (ref 1.4–6.5)
Neutrophil %: 87.1 %
Platelet: 146 10*3/uL — ABNORMAL LOW (ref 150–440)
RBC: 3.64 10*6/uL — ABNORMAL LOW (ref 4.40–5.90)
RDW: 16.7 % — AB (ref 11.5–14.5)
WBC: 9.7 10*3/uL (ref 3.8–10.6)

## 2013-10-30 LAB — BASIC METABOLIC PANEL
Anion Gap: 3 — ABNORMAL LOW (ref 7–16)
BUN: 22 mg/dL — ABNORMAL HIGH (ref 7–18)
CREATININE: 1.28 mg/dL (ref 0.60–1.30)
Calcium, Total: 8.6 mg/dL (ref 8.5–10.1)
Chloride: 102 mmol/L (ref 98–107)
Co2: 35 mmol/L — ABNORMAL HIGH (ref 21–32)
EGFR (African American): >60
EGFR (Non-African Amer.): 60 — ABNORMAL LOW
GLUCOSE: 156 mg/dL — AB (ref 65–99)
Osmolality: 286 (ref 275–301)
POTASSIUM: 3.3 mmol/L — AB (ref 3.5–5.1)
Sodium: 140 mmol/L (ref 136–145)

## 2013-10-31 LAB — CLOSTRIDIUM DIFFICILE(ARMC)

## 2013-11-01 LAB — CBC WITH DIFFERENTIAL/PLATELET
BASOS PCT: 0.7 %
Basophil #: 0.1 10*3/uL (ref 0.0–0.1)
EOS ABS: 0 10*3/uL (ref 0.0–0.7)
Eosinophil %: 0 %
HCT: 32.6 % — AB (ref 40.0–52.0)
HGB: 11.2 g/dL — ABNORMAL LOW (ref 13.0–18.0)
Lymphocyte #: 1.9 10*3/uL (ref 1.0–3.6)
Lymphocyte %: 16.7 %
MCH: 31.6 pg (ref 26.0–34.0)
MCHC: 34.4 g/dL (ref 32.0–36.0)
MCV: 92 fL (ref 80–100)
MONO ABS: 0.5 x10 3/mm (ref 0.2–1.0)
Monocyte %: 4.2 %
Neutrophil #: 9.1 10*3/uL — ABNORMAL HIGH (ref 1.4–6.5)
Neutrophil %: 78.4 %
Platelet: 123 10*3/uL — ABNORMAL LOW (ref 150–440)
RBC: 3.55 10*6/uL — AB (ref 4.40–5.90)
RDW: 16.7 % — AB (ref 11.5–14.5)
WBC: 11.6 10*3/uL — AB (ref 3.8–10.6)

## 2013-11-01 LAB — BASIC METABOLIC PANEL
Anion Gap: 5 — ABNORMAL LOW (ref 7–16)
BUN: 34 mg/dL — ABNORMAL HIGH (ref 7–18)
CHLORIDE: 101 mmol/L (ref 98–107)
Calcium, Total: 8.8 mg/dL (ref 8.5–10.1)
Co2: 31 mmol/L (ref 21–32)
Creatinine: 1.14 mg/dL (ref 0.60–1.30)
EGFR (Non-African Amer.): >60
Glucose: 121 mg/dL — ABNORMAL HIGH (ref 65–99)
OSMOLALITY: 283 (ref 275–301)
Potassium: 4.6 mmol/L (ref 3.5–5.1)
Sodium: 137 mmol/L (ref 136–145)

## 2013-11-01 LAB — CULTURE, BLOOD (SINGLE)

## 2013-11-03 LAB — CULTURE, BLOOD (SINGLE)

## 2013-11-08 ENCOUNTER — Inpatient Hospital Stay: Payer: Self-pay | Admitting: Internal Medicine

## 2013-11-08 LAB — COMPREHENSIVE METABOLIC PANEL
ALK PHOS: 128 U/L — AB
Albumin: 2.7 g/dL — ABNORMAL LOW (ref 3.4–5.0)
Anion Gap: 4 — ABNORMAL LOW (ref 7–16)
BILIRUBIN TOTAL: 0.4 mg/dL (ref 0.2–1.0)
BUN: 54 mg/dL — ABNORMAL HIGH (ref 7–18)
CHLORIDE: 108 mmol/L — AB (ref 98–107)
Calcium, Total: 8.4 mg/dL — ABNORMAL LOW (ref 8.5–10.1)
Co2: 29 mmol/L (ref 21–32)
Creatinine: 1.69 mg/dL — ABNORMAL HIGH (ref 0.60–1.30)
EGFR (African American): 49 — ABNORMAL LOW
GFR CALC NON AF AMER: 43 — AB
GLUCOSE: 47 mg/dL — AB (ref 65–99)
Osmolality: 293 (ref 275–301)
Potassium: 5.1 mmol/L (ref 3.5–5.1)
SGOT(AST): 14 U/L — ABNORMAL LOW (ref 15–37)
SGPT (ALT): 28 U/L (ref 12–78)
SODIUM: 141 mmol/L (ref 136–145)
TOTAL PROTEIN: 6.5 g/dL (ref 6.4–8.2)

## 2013-11-08 LAB — URINALYSIS, COMPLETE
Bilirubin,UR: NEGATIVE
GLUCOSE, UR: NEGATIVE mg/dL (ref 0–75)
Ketone: NEGATIVE
NITRITE: NEGATIVE
Ph: 6 (ref 4.5–8.0)
Protein: 100
RBC,UR: 2 /HPF (ref 0–5)
SPECIFIC GRAVITY: 1.015 (ref 1.003–1.030)
Squamous Epithelial: NONE SEEN

## 2013-11-08 LAB — CBC
HCT: 37.2 % — AB (ref 40.0–52.0)
HGB: 12.4 g/dL — ABNORMAL LOW (ref 13.0–18.0)
MCH: 31.5 pg (ref 26.0–34.0)
MCHC: 33.2 g/dL (ref 32.0–36.0)
MCV: 95 fL (ref 80–100)
Platelet: 125 10*3/uL — ABNORMAL LOW (ref 150–440)
RBC: 3.93 10*6/uL — AB (ref 4.40–5.90)
RDW: 17.5 % — ABNORMAL HIGH (ref 11.5–14.5)
WBC: 11.6 10*3/uL — ABNORMAL HIGH (ref 3.8–10.6)

## 2013-11-08 LAB — CK: CK, Total: 51 U/L

## 2013-11-08 LAB — TROPONIN I

## 2013-11-09 ENCOUNTER — Ambulatory Visit: Payer: Self-pay | Admitting: Internal Medicine

## 2013-11-09 LAB — CBC WITH DIFFERENTIAL/PLATELET
BASOS PCT: 0 %
Basophil #: 0 10*3/uL (ref 0.0–0.1)
Eosinophil #: 0 10*3/uL (ref 0.0–0.7)
Eosinophil %: 0 %
HCT: 30.5 % — ABNORMAL LOW (ref 40.0–52.0)
HGB: 10.4 g/dL — ABNORMAL LOW (ref 13.0–18.0)
Lymphocyte #: 0.6 10*3/uL — ABNORMAL LOW (ref 1.0–3.6)
Lymphocyte %: 12.3 %
MCH: 32.3 pg (ref 26.0–34.0)
MCHC: 34.2 g/dL (ref 32.0–36.0)
MCV: 95 fL (ref 80–100)
Monocyte #: 0.1 x10 3/mm — ABNORMAL LOW (ref 0.2–1.0)
Monocyte %: 1.2 %
Neutrophil #: 4.3 10*3/uL (ref 1.4–6.5)
Neutrophil %: 86.5 %
PLATELETS: 106 10*3/uL — AB (ref 150–440)
RBC: 3.23 10*6/uL — ABNORMAL LOW (ref 4.40–5.90)
RDW: 17.2 % — AB (ref 11.5–14.5)
WBC: 5 10*3/uL (ref 3.8–10.6)

## 2013-11-09 LAB — BASIC METABOLIC PANEL
Anion Gap: 4 — ABNORMAL LOW (ref 7–16)
BUN: 41 mg/dL — ABNORMAL HIGH (ref 7–18)
CALCIUM: 8.2 mg/dL — AB (ref 8.5–10.1)
Chloride: 102 mmol/L (ref 98–107)
Co2: 29 mmol/L (ref 21–32)
Creatinine: 1.56 mg/dL — ABNORMAL HIGH (ref 0.60–1.30)
EGFR (African American): 54 — ABNORMAL LOW
GFR CALC NON AF AMER: 47 — AB
Glucose: 369 mg/dL — ABNORMAL HIGH (ref 65–99)
Osmolality: 295 (ref 275–301)
Potassium: 5.5 mmol/L — ABNORMAL HIGH (ref 3.5–5.1)
Sodium: 135 mmol/L — ABNORMAL LOW (ref 136–145)

## 2013-11-09 LAB — HEMOGLOBIN A1C: Hemoglobin A1C: 7.1 % — ABNORMAL HIGH (ref 4.2–6.3)

## 2013-11-09 LAB — POTASSIUM: POTASSIUM: 4.5 mmol/L (ref 3.5–5.1)

## 2013-11-10 LAB — BASIC METABOLIC PANEL
Anion Gap: 3 — ABNORMAL LOW (ref 7–16)
BUN: 40 mg/dL — ABNORMAL HIGH (ref 7–18)
CHLORIDE: 106 mmol/L (ref 98–107)
CO2: 31 mmol/L (ref 21–32)
Calcium, Total: 8.4 mg/dL — ABNORMAL LOW (ref 8.5–10.1)
Creatinine: 1.43 mg/dL — ABNORMAL HIGH (ref 0.60–1.30)
EGFR (African American): >60
EGFR (Non-African Amer.): 52 — ABNORMAL LOW
Glucose: 130 mg/dL — ABNORMAL HIGH (ref 65–99)
OSMOLALITY: 291 (ref 275–301)
Potassium: 4.3 mmol/L (ref 3.5–5.1)
Sodium: 140 mmol/L (ref 136–145)

## 2013-11-10 LAB — CULTURE, BLOOD (SINGLE)

## 2013-11-10 LAB — URINE CULTURE

## 2013-11-11 LAB — PLATELET COUNT: Platelet: 135 10*3/uL — ABNORMAL LOW (ref 150–440)

## 2013-11-13 LAB — CULTURE, BLOOD (SINGLE)

## 2013-11-18 ENCOUNTER — Ambulatory Visit: Payer: Self-pay | Admitting: Internal Medicine

## 2013-11-25 ENCOUNTER — Inpatient Hospital Stay: Payer: Self-pay | Admitting: Internal Medicine

## 2013-11-25 LAB — URIC ACID: Uric Acid: 5.1 mg/dL (ref 3.5–7.2)

## 2013-11-25 LAB — URINALYSIS, COMPLETE
BACTERIA: NONE SEEN
Bilirubin,UR: NEGATIVE
KETONE: NEGATIVE
Leukocyte Esterase: NEGATIVE
NITRITE: NEGATIVE
PH: 5 (ref 4.5–8.0)
Protein: NEGATIVE
RBC,UR: 39 /HPF (ref 0–5)
Specific Gravity: 1.015 (ref 1.003–1.030)
Squamous Epithelial: NONE SEEN

## 2013-11-25 LAB — COMPREHENSIVE METABOLIC PANEL
AST: 17 U/L (ref 15–37)
Albumin: 2.1 g/dL — ABNORMAL LOW (ref 3.4–5.0)
Alkaline Phosphatase: 86 U/L
Anion Gap: 6 — ABNORMAL LOW (ref 7–16)
BUN: 30 mg/dL — ABNORMAL HIGH (ref 7–18)
Bilirubin,Total: 0.3 mg/dL (ref 0.2–1.0)
CALCIUM: 7.7 mg/dL — AB (ref 8.5–10.1)
CHLORIDE: 104 mmol/L (ref 98–107)
Co2: 28 mmol/L (ref 21–32)
Creatinine: 1.77 mg/dL — ABNORMAL HIGH (ref 0.60–1.30)
EGFR (African American): 47 — ABNORMAL LOW
EGFR (Non-African Amer.): 40 — ABNORMAL LOW
GLUCOSE: 411 mg/dL — AB (ref 65–99)
Osmolality: 299 (ref 275–301)
Potassium: 4.6 mmol/L (ref 3.5–5.1)
SGPT (ALT): 13 U/L (ref 12–78)
SODIUM: 138 mmol/L (ref 136–145)
Total Protein: 5.9 g/dL — ABNORMAL LOW (ref 6.4–8.2)

## 2013-11-25 LAB — CBC WITH DIFFERENTIAL/PLATELET
Basophil #: 0 10*3/uL (ref 0.0–0.1)
Basophil %: 0.5 %
EOS ABS: 0 10*3/uL (ref 0.0–0.7)
Eosinophil %: 0.3 %
HCT: 25.6 % — ABNORMAL LOW (ref 40.0–52.0)
HGB: 8.2 g/dL — AB (ref 13.0–18.0)
LYMPHS ABS: 1.4 10*3/uL (ref 1.0–3.6)
Lymphocyte %: 16.5 %
MCH: 30.5 pg (ref 26.0–34.0)
MCHC: 32 g/dL (ref 32.0–36.0)
MCV: 95 fL (ref 80–100)
Monocyte #: 0.7 x10 3/mm (ref 0.2–1.0)
Monocyte %: 9.1 %
NEUTROS ABS: 6 10*3/uL (ref 1.4–6.5)
NEUTROS PCT: 73.6 %
PLATELETS: 207 10*3/uL (ref 150–440)
RBC: 2.69 10*6/uL — AB (ref 4.40–5.90)
RDW: 15.9 % — AB (ref 11.5–14.5)
WBC: 8.2 10*3/uL (ref 3.8–10.6)

## 2013-11-25 LAB — TROPONIN I: Troponin-I: <0.02 ng/mL

## 2013-11-25 LAB — TSH: THYROID STIMULATING HORM: 2.35 u[IU]/mL

## 2013-11-25 LAB — VALPROIC ACID LEVEL: VALPROIC ACID: 46 ug/mL — AB

## 2013-11-26 LAB — BASIC METABOLIC PANEL
BUN: 23 mg/dL — ABNORMAL HIGH (ref 7–18)
CALCIUM: 7.6 mg/dL — AB (ref 8.5–10.1)
Chloride: 113 mmol/L — ABNORMAL HIGH (ref 98–107)
Co2: 27 mmol/L (ref 21–32)
Creatinine: 1.16 mg/dL (ref 0.60–1.30)
EGFR (African American): >60
EGFR (Non-African Amer.): >60
Glucose: 274 mg/dL — ABNORMAL HIGH (ref 65–99)
OSMOLALITY: 291 (ref 275–301)
POTASSIUM: 4.8 mmol/L (ref 3.5–5.1)
Sodium: 139 mmol/L (ref 136–145)

## 2013-11-26 LAB — IRON AND TIBC
Iron Bind.Cap.(Total): 157 ug/dL — ABNORMAL LOW (ref 250–450)
Iron Saturation: 20 %
Iron: 32 ug/dL — ABNORMAL LOW (ref 65–175)
UNBOUND IRON-BIND. CAP.: 125 ug/dL

## 2013-11-26 LAB — CBC WITH DIFFERENTIAL/PLATELET
Basophil #: 0 10*3/uL (ref 0.0–0.1)
Basophil %: 0.2 %
EOS ABS: 0 10*3/uL (ref 0.0–0.7)
Eosinophil %: 0 %
HCT: 22.6 % — AB (ref 40.0–52.0)
HGB: 7.9 g/dL — ABNORMAL LOW (ref 13.0–18.0)
LYMPHS ABS: 1 10*3/uL (ref 1.0–3.6)
LYMPHS PCT: 14.9 %
MCH: 33 pg (ref 26.0–34.0)
MCHC: 35 g/dL (ref 32.0–36.0)
MCV: 94 fL (ref 80–100)
MONO ABS: 0.2 x10 3/mm (ref 0.2–1.0)
MONOS PCT: 3.4 %
NEUTROS PCT: 81.5 %
Neutrophil #: 5.6 10*3/uL (ref 1.4–6.5)
Platelet: 177 10*3/uL (ref 150–440)
RBC: 2.4 10*6/uL — AB (ref 4.40–5.90)
RDW: 15.7 % — ABNORMAL HIGH (ref 11.5–14.5)
WBC: 6.8 10*3/uL (ref 3.8–10.6)

## 2013-11-26 LAB — MAGNESIUM: Magnesium: 1.9 mg/dL

## 2013-11-26 LAB — URINE CULTURE

## 2013-11-26 LAB — FERRITIN: FERRITIN (ARMC): 275 ng/mL (ref 8–388)

## 2013-11-27 LAB — CBC WITH DIFFERENTIAL/PLATELET
BASOS ABS: 0 10*3/uL (ref 0.0–0.1)
Basophil %: 0 %
EOS ABS: 0 10*3/uL (ref 0.0–0.7)
Eosinophil %: 0.1 %
HCT: 25.6 % — ABNORMAL LOW (ref 40.0–52.0)
HGB: 8.9 g/dL — ABNORMAL LOW (ref 13.0–18.0)
LYMPHS PCT: 19.8 %
Lymphocyte #: 1.8 10*3/uL (ref 1.0–3.6)
MCH: 32.6 pg (ref 26.0–34.0)
MCHC: 34.7 g/dL (ref 32.0–36.0)
MCV: 94 fL (ref 80–100)
MONO ABS: 0.5 x10 3/mm (ref 0.2–1.0)
Monocyte %: 5.8 %
Neutrophil #: 6.6 10*3/uL — ABNORMAL HIGH (ref 1.4–6.5)
Neutrophil %: 74.3 %
PLATELETS: 215 10*3/uL (ref 150–440)
RBC: 2.73 10*6/uL — AB (ref 4.40–5.90)
RDW: 15.5 % — AB (ref 11.5–14.5)
WBC: 8.9 10*3/uL (ref 3.8–10.6)

## 2013-11-27 LAB — CREATININE, SERUM
CREATININE: 1.16 mg/dL (ref 0.60–1.30)
EGFR (African American): >60

## 2013-11-30 LAB — CULTURE, BLOOD (SINGLE)

## 2013-12-08 ENCOUNTER — Inpatient Hospital Stay: Payer: Self-pay | Admitting: Internal Medicine

## 2013-12-08 LAB — CBC WITH DIFFERENTIAL/PLATELET
BASOS PCT: 0.3 %
Basophil #: 0.1 10*3/uL (ref 0.0–0.1)
Eosinophil #: 0 10*3/uL (ref 0.0–0.7)
Eosinophil %: 0.1 %
HCT: 30.8 % — ABNORMAL LOW (ref 40.0–52.0)
HGB: 9.9 g/dL — AB (ref 13.0–18.0)
Lymphocyte #: 1.3 10*3/uL (ref 1.0–3.6)
Lymphocyte %: 5 %
MCH: 31.1 pg (ref 26.0–34.0)
MCHC: 32.1 g/dL (ref 32.0–36.0)
MCV: 97 fL (ref 80–100)
Monocyte #: 2 x10 3/mm — ABNORMAL HIGH (ref 0.2–1.0)
Monocyte %: 7.7 %
NEUTROS ABS: 22.3 10*3/uL — AB (ref 1.4–6.5)
NEUTROS PCT: 86.9 %
Platelet: 239 10*3/uL (ref 150–440)
RBC: 3.18 10*6/uL — AB (ref 4.40–5.90)
RDW: 16.4 % — AB (ref 11.5–14.5)
WBC: 25.7 10*3/uL — ABNORMAL HIGH (ref 3.8–10.6)

## 2013-12-08 LAB — URINALYSIS, COMPLETE
Bilirubin,UR: NEGATIVE
Glucose,UR: 50 mg/dL (ref 0–75)
Leukocyte Esterase: NEGATIVE
NITRITE: NEGATIVE
Ph: 5 (ref 4.5–8.0)
RBC,UR: 164 /HPF (ref 0–5)
SPECIFIC GRAVITY: 1.018 (ref 1.003–1.030)
Squamous Epithelial: NONE SEEN
WBC UR: 11 /HPF (ref 0–5)

## 2013-12-08 LAB — COMPREHENSIVE METABOLIC PANEL
ALBUMIN: 2.2 g/dL — AB (ref 3.4–5.0)
ALT: 18 U/L (ref 12–78)
Alkaline Phosphatase: 86 U/L
Anion Gap: 6 — ABNORMAL LOW (ref 7–16)
BUN: 29 mg/dL — AB (ref 7–18)
Bilirubin,Total: 0.3 mg/dL (ref 0.2–1.0)
CALCIUM: 8.5 mg/dL (ref 8.5–10.1)
CREATININE: 1.62 mg/dL — AB (ref 0.60–1.30)
Chloride: 106 mmol/L (ref 98–107)
Co2: 28 mmol/L (ref 21–32)
EGFR (African American): 52 — ABNORMAL LOW
GFR CALC NON AF AMER: 45 — AB
Glucose: 247 mg/dL — ABNORMAL HIGH (ref 65–99)
Osmolality: 293 (ref 275–301)
POTASSIUM: 4.5 mmol/L (ref 3.5–5.1)
SGOT(AST): 14 U/L — ABNORMAL LOW (ref 15–37)
Sodium: 140 mmol/L (ref 136–145)
Total Protein: 6.2 g/dL — ABNORMAL LOW (ref 6.4–8.2)

## 2013-12-08 LAB — PROTIME-INR
INR: 1.1
Prothrombin Time: 14 secs (ref 11.5–14.7)

## 2013-12-08 LAB — PHOSPHORUS: Phosphorus: 3.9 mg/dL (ref 2.5–4.9)

## 2013-12-08 LAB — MAGNESIUM: Magnesium: 1.9 mg/dL

## 2013-12-08 LAB — AMMONIA: Ammonia, Plasma: 18 mcmol/L (ref 11–32)

## 2013-12-08 LAB — LIPASE, BLOOD: Lipase: 36 U/L — ABNORMAL LOW (ref 73–393)

## 2013-12-08 LAB — TROPONIN I: Troponin-I: <0.02 ng/mL

## 2013-12-09 LAB — COMPREHENSIVE METABOLIC PANEL
Albumin: 1.9 g/dL — ABNORMAL LOW (ref 3.4–5.0)
Alkaline Phosphatase: 85 U/L
Anion Gap: 10 (ref 7–16)
BUN: 32 mg/dL — AB (ref 7–18)
Bilirubin,Total: 0.4 mg/dL (ref 0.2–1.0)
CALCIUM: 8.3 mg/dL — AB (ref 8.5–10.1)
CHLORIDE: 105 mmol/L (ref 98–107)
Co2: 23 mmol/L (ref 21–32)
Creatinine: 1.33 mg/dL — ABNORMAL HIGH (ref 0.60–1.30)
EGFR (Non-African Amer.): 57 — ABNORMAL LOW
GLUCOSE: 336 mg/dL — AB (ref 65–99)
Osmolality: 296 (ref 275–301)
Potassium: 4.4 mmol/L (ref 3.5–5.1)
SGOT(AST): 14 U/L — ABNORMAL LOW (ref 15–37)
SGPT (ALT): 15 U/L (ref 12–78)
SODIUM: 138 mmol/L (ref 136–145)
Total Protein: 6.1 g/dL — ABNORMAL LOW (ref 6.4–8.2)

## 2013-12-09 LAB — CBC WITH DIFFERENTIAL/PLATELET
BASOS ABS: 0 10*3/uL (ref 0.0–0.1)
Basophil %: 0.1 %
Eosinophil #: 0 10*3/uL (ref 0.0–0.7)
Eosinophil %: 0 %
HCT: 28.3 % — AB (ref 40.0–52.0)
HGB: 9.4 g/dL — AB (ref 13.0–18.0)
LYMPHS ABS: 0.8 10*3/uL — AB (ref 1.0–3.6)
LYMPHS PCT: 6.3 %
MCH: 32.3 pg (ref 26.0–34.0)
MCHC: 33.3 g/dL (ref 32.0–36.0)
MCV: 97 fL (ref 80–100)
MONOS PCT: 1.6 %
Monocyte #: 0.2 x10 3/mm (ref 0.2–1.0)
Neutrophil #: 12.3 10*3/uL — ABNORMAL HIGH (ref 1.4–6.5)
Neutrophil %: 92 %
Platelet: 197 10*3/uL (ref 150–440)
RBC: 2.92 10*6/uL — AB (ref 4.40–5.90)
RDW: 15.9 % — ABNORMAL HIGH (ref 11.5–14.5)
WBC: 13.4 10*3/uL — AB (ref 3.8–10.6)

## 2013-12-09 LAB — URINE CULTURE

## 2013-12-09 LAB — VALPROIC ACID LEVEL: Valproic Acid: 15 ug/mL — ABNORMAL LOW

## 2013-12-10 LAB — CBC WITH DIFFERENTIAL/PLATELET
BASOS PCT: 0.1 %
Basophil #: 0 10*3/uL (ref 0.0–0.1)
EOS PCT: 0 %
Eosinophil #: 0 10*3/uL (ref 0.0–0.7)
HCT: 25.9 % — AB (ref 40.0–52.0)
HGB: 8.7 g/dL — ABNORMAL LOW (ref 13.0–18.0)
Lymphocyte #: 1.6 10*3/uL (ref 1.0–3.6)
Lymphocyte %: 11.7 %
MCH: 31.7 pg (ref 26.0–34.0)
MCHC: 33.7 g/dL (ref 32.0–36.0)
MCV: 94 fL (ref 80–100)
Monocyte #: 1 x10 3/mm (ref 0.2–1.0)
Monocyte %: 7.5 %
Neutrophil #: 10.9 10*3/uL — ABNORMAL HIGH (ref 1.4–6.5)
Neutrophil %: 80.7 %
PLATELETS: 218 10*3/uL (ref 150–440)
RBC: 2.76 10*6/uL — ABNORMAL LOW (ref 4.40–5.90)
RDW: 16.1 % — ABNORMAL HIGH (ref 11.5–14.5)
WBC: 13.5 10*3/uL — AB (ref 3.8–10.6)

## 2013-12-13 LAB — CULTURE, BLOOD (SINGLE)

## 2013-12-19 ENCOUNTER — Ambulatory Visit: Payer: Self-pay | Admitting: Internal Medicine

## 2013-12-19 DEATH — deceased

## 2014-06-30 IMAGING — CT CT HEAD WITHOUT CONTRAST
1 series · 16 of 30 positions shown, 20 images · non-contrast
Comparison: 11/08/13.

CLINICAL DATA: Dementia.  Recent fall.  Bruising.

EXAM:
CT HEAD WITHOUT CONTRAST
TECHNIQUE: Contiguous axial images were obtained from the base of the skull
through the vertex without contrast.

[Series 2: head wo · axial · 0.47mm/px · z∈[-25,+119]mm · 16 of 36 slices shown, 20 images]
[im 2/36  brain]
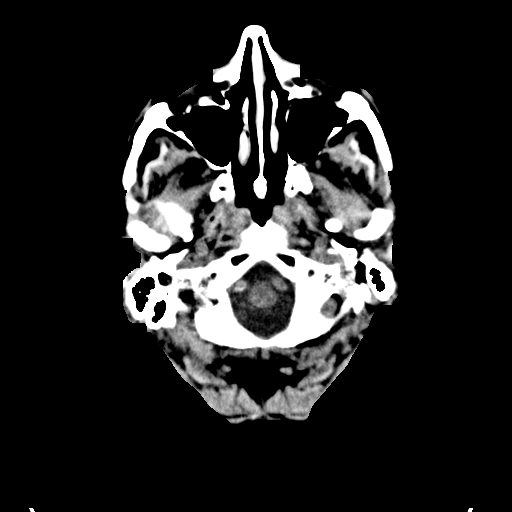
[im 2/36  bone]
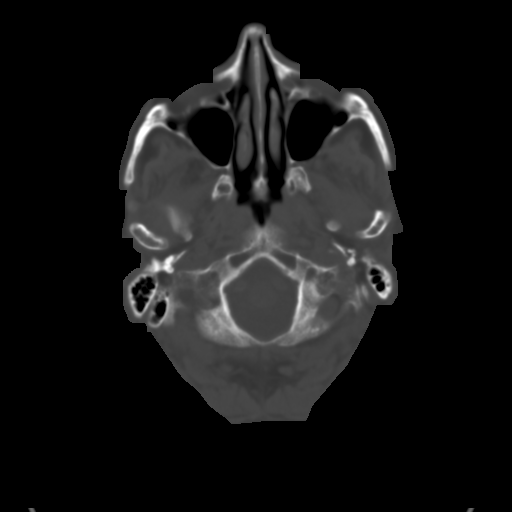
[im 4/36  brain]
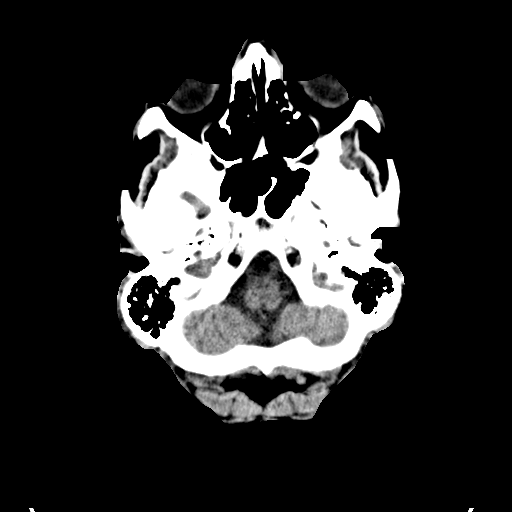
[im 7/36  brain]
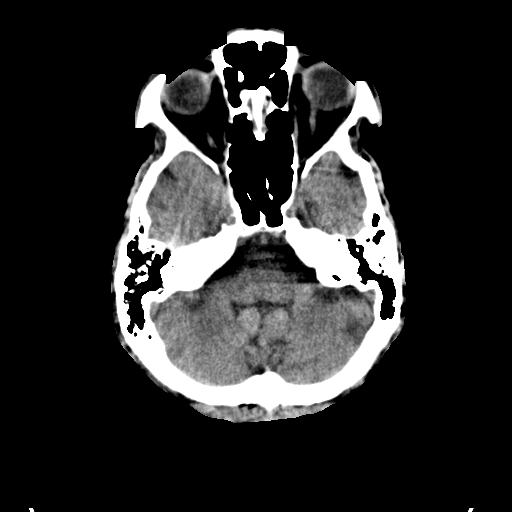
[im 9/36  brain]
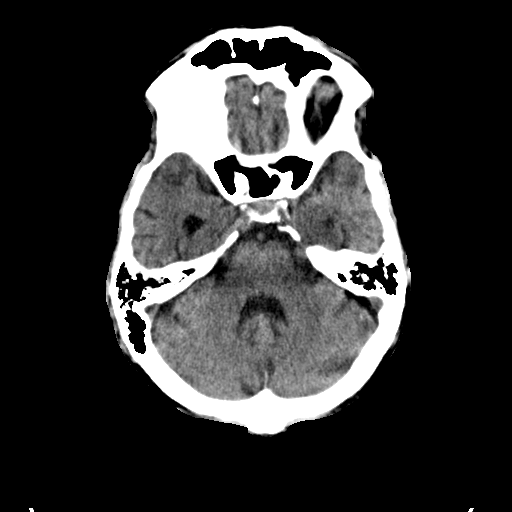
[im 10/36  brain]
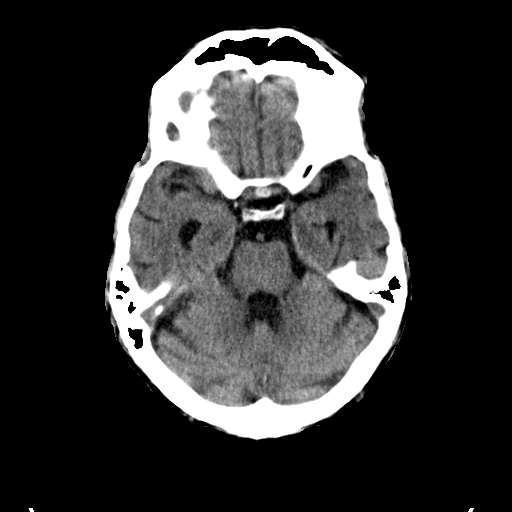
[im 10/36  bone]
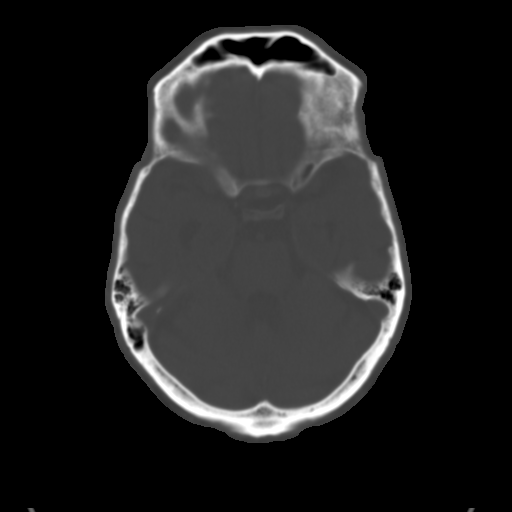
[im 13/36  brain]
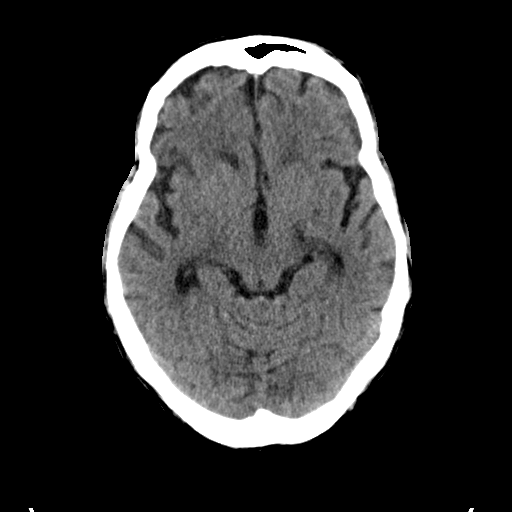
[im 15/36  brain]
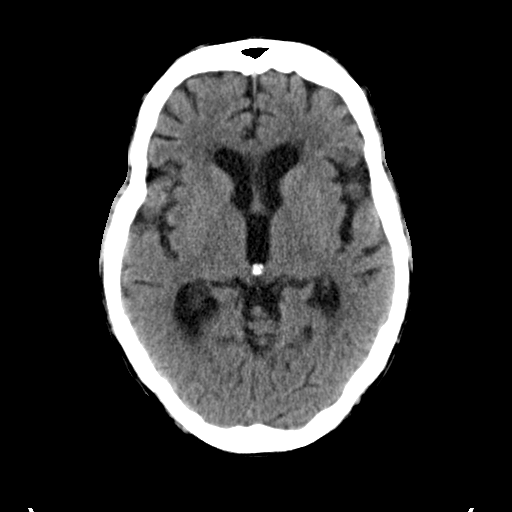
[im 17/36  brain]
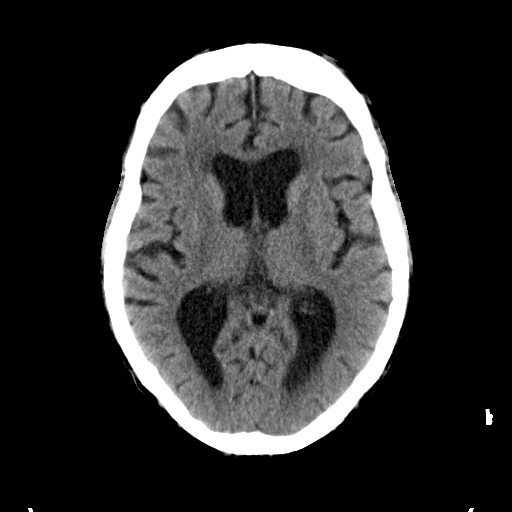
[im 19/36  brain]
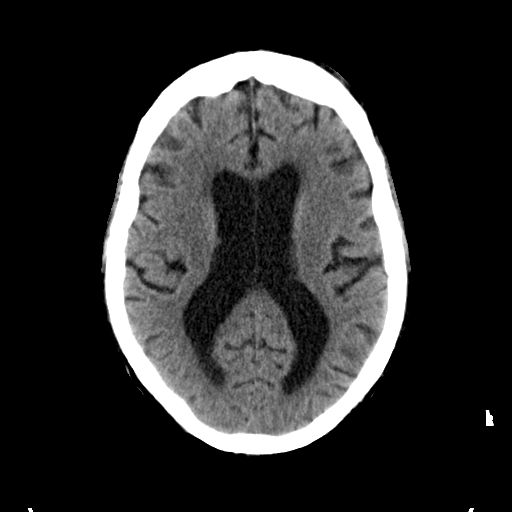
[im 19/36  bone]
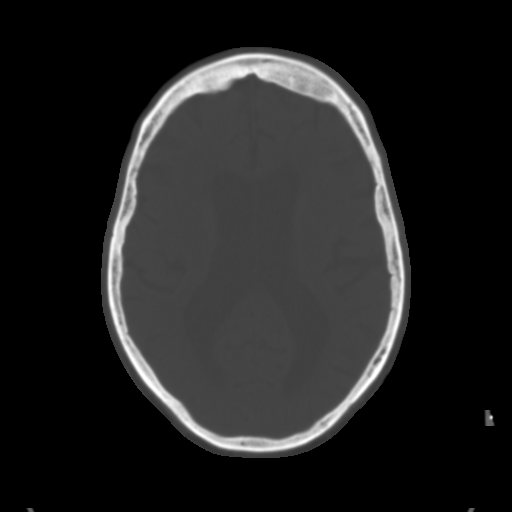
[im 21/36  brain]
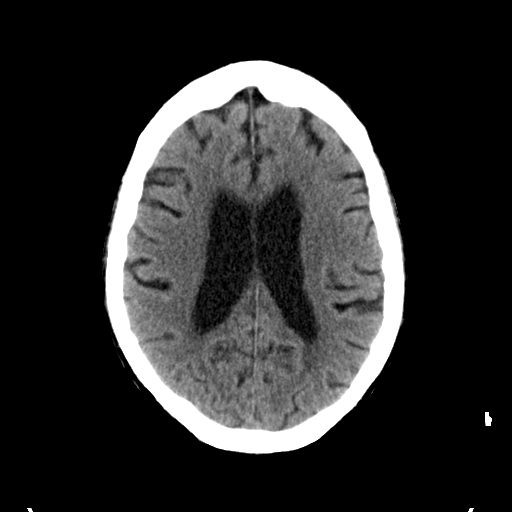
[im 23/36  brain]
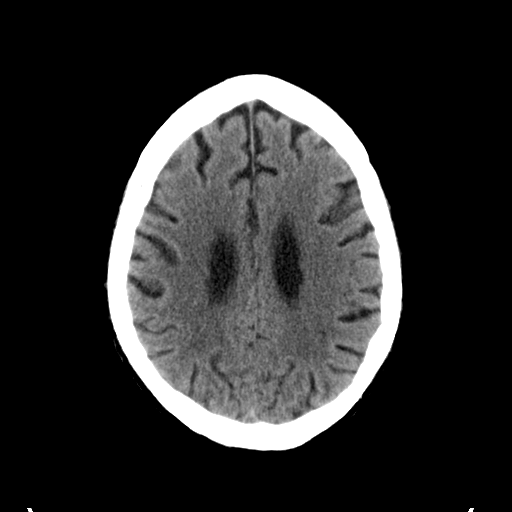
[im 26/36  brain]
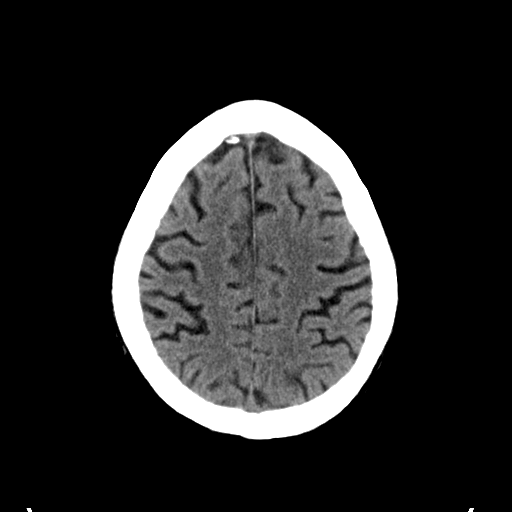
[im 27/36  brain]
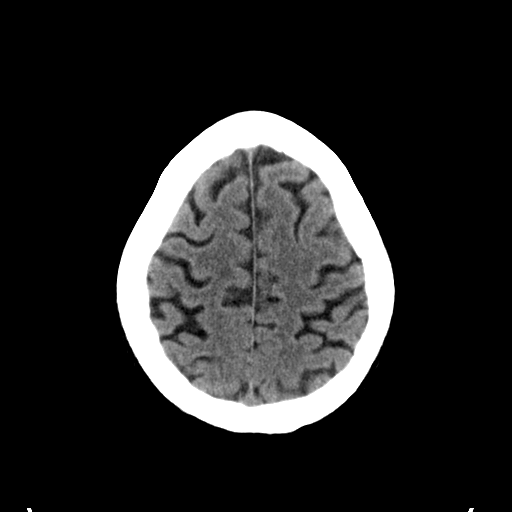
[im 27/36  bone]
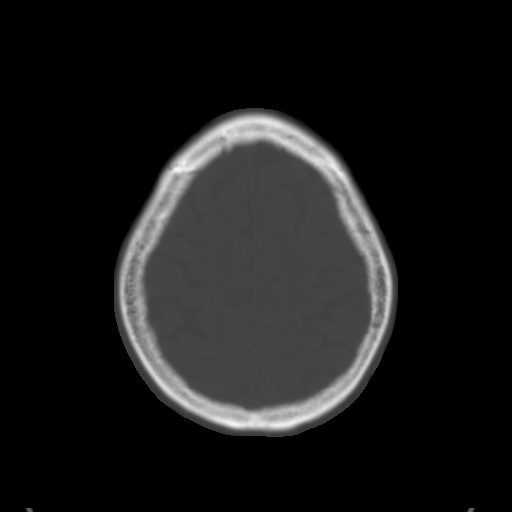
[im 29/36  brain]
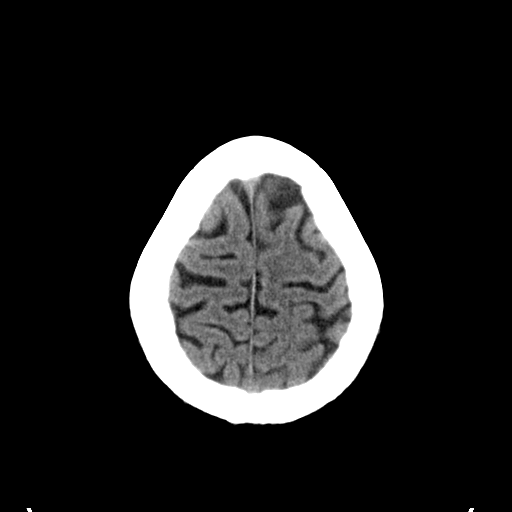
[im 32/36  brain]
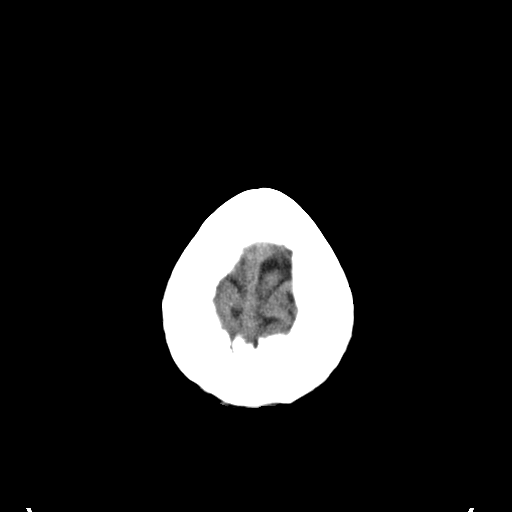
[im 34/36  brain]
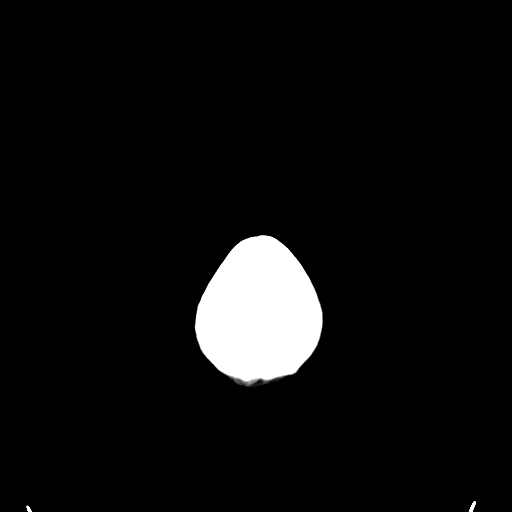

[16 of 30 positions shown; findings below may reference images not displayed]

FINDINGS: No evidence for acute infarction, hemorrhage, mass lesion,
hydrocephalus, or extra-axial fluid. Advanced atrophy premature for
age. Chronic microvascular ischemic change. Calvarium intact. Mild
left frontal scalp hematoma. Negative orbits. No sinus or mastoid
disease. Similar appearance to priors.
IMPRESSION: Left frontal scalp hematoma. No skull fracture or intracranial
hemorrhage. Premature atrophy and small vessel disease, similar to
priors.

## 2014-06-30 IMAGING — CR LEFT WRIST - 2 VIEW
1 series · 2 of 2 positions shown · non-contrast
Comparison: 10/13/2013

CLINICAL DATA: Redness, pain.  No known injury.

EXAM:
LEFT WRIST - 2 VIEW

[Series 1: pa · 0.17mm/px · 2 of 2 slices shown]
[im 1/2]
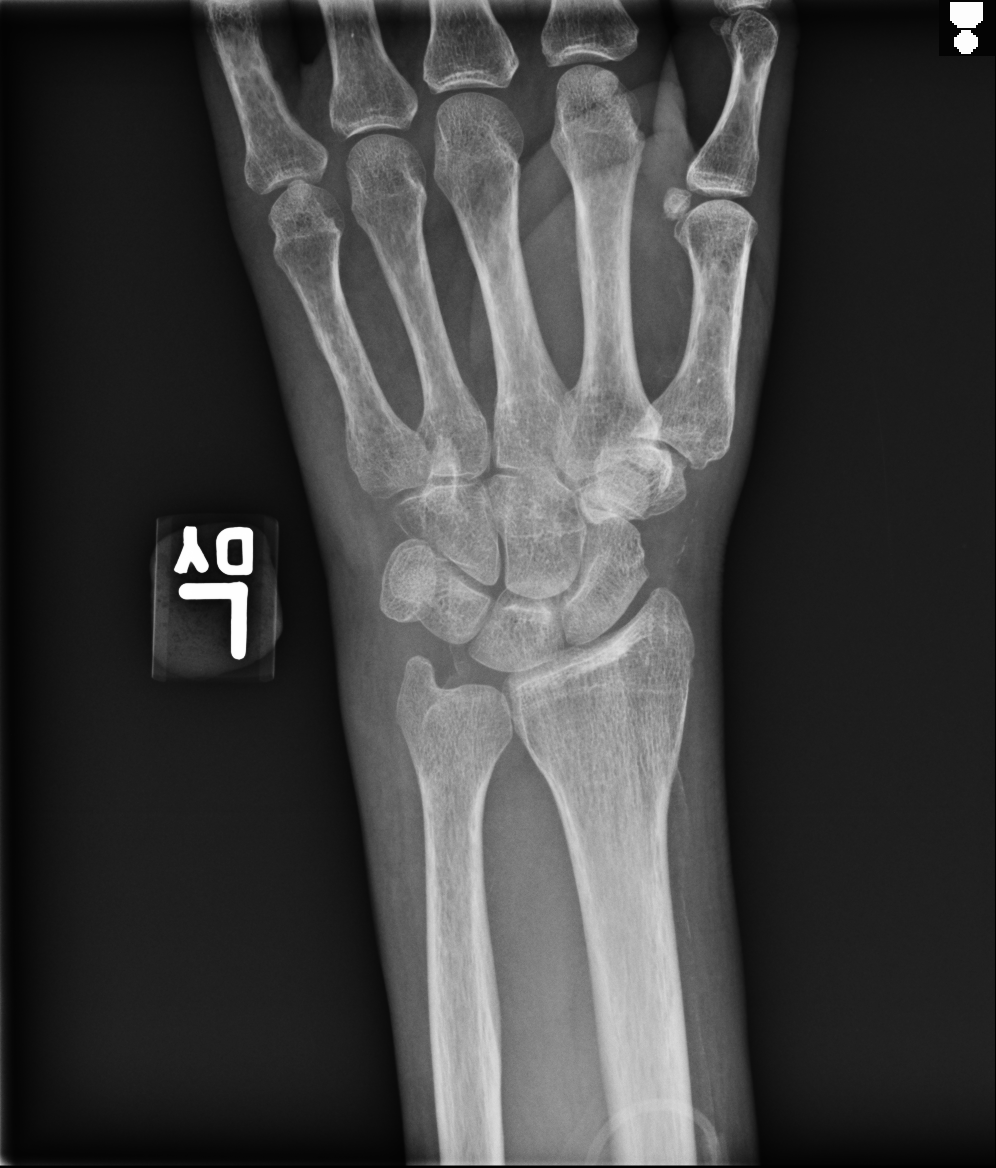
[im 2/2]
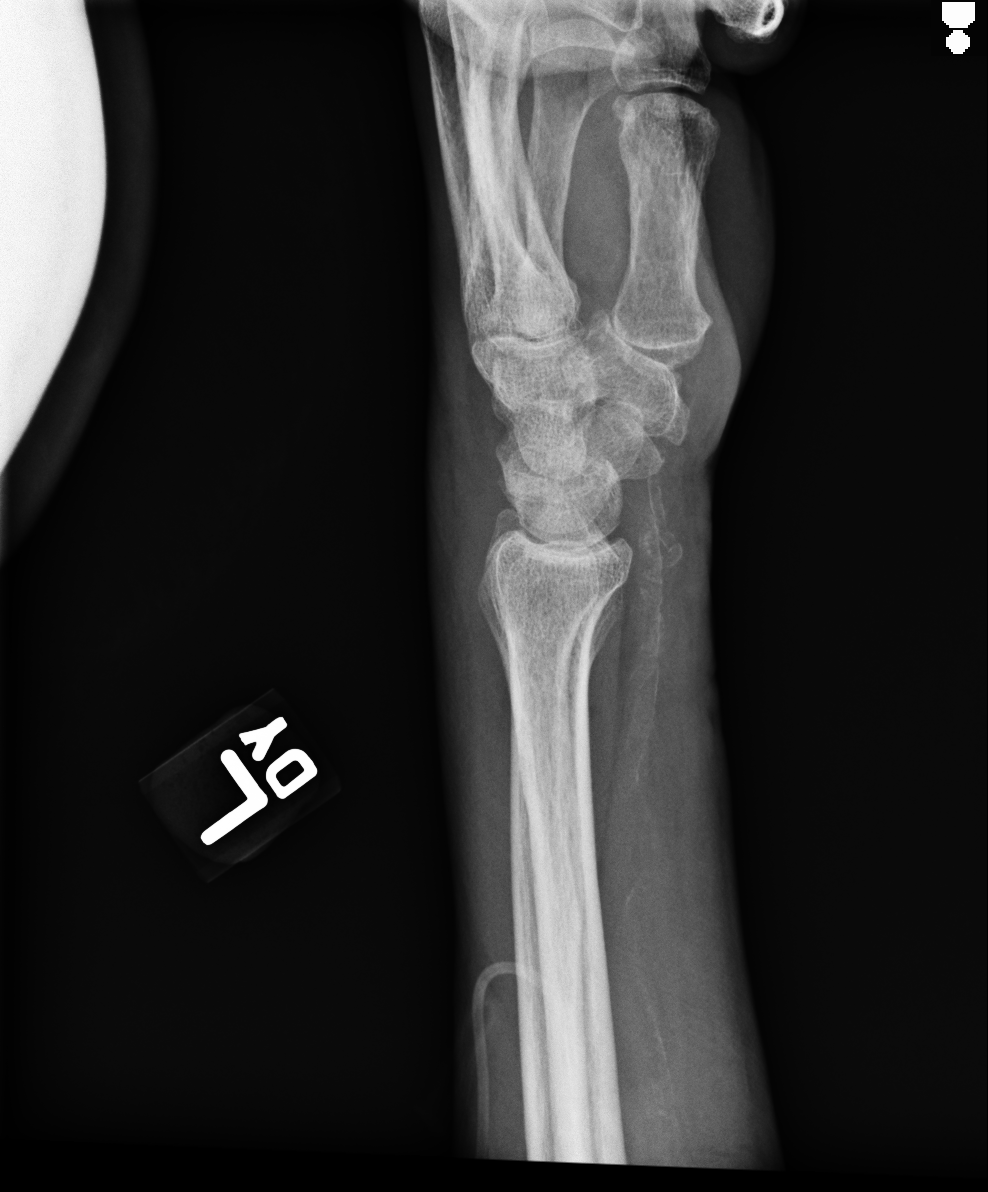

[2 of 2 positions shown; findings below may reference images not displayed]

FINDINGS: Moderate degenerative changes in the first carpometacarpal joint.
Slight joint space narrowing within the radiocarpal joint. No
fracture, subluxation or dislocation. Vascular calcifications noted
within the wrist.
IMPRESSION: No acute bony abnormality.

## 2014-07-13 IMAGING — CT CT HEAD WITHOUT CONTRAST
4 series · 16 of 30 positions shown, 17 images · non-contrast
Comparison: CT scan of November 25, 2013.

CLINICAL DATA: Altered mental status.

EXAM:
CT HEAD WITHOUT CONTRAST
TECHNIQUE: Contiguous axial images were obtained from the base of the skull
through the vertex without intravenous contrast.

[Series 2: head wo · axial · 0.58mm/px · z∈[+392,+472]mm · 3 of 32 slices shown, 4 images]
[im 8/32  brain]
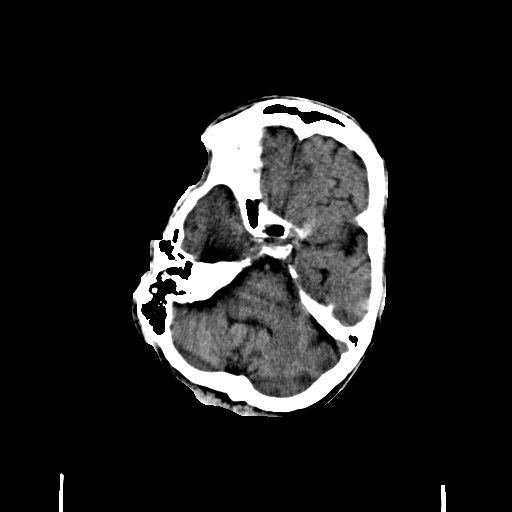
[im 8/32  bone]
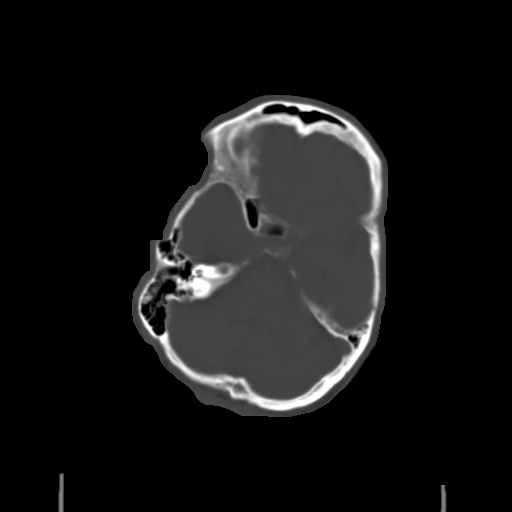
[im 16/32  brain]
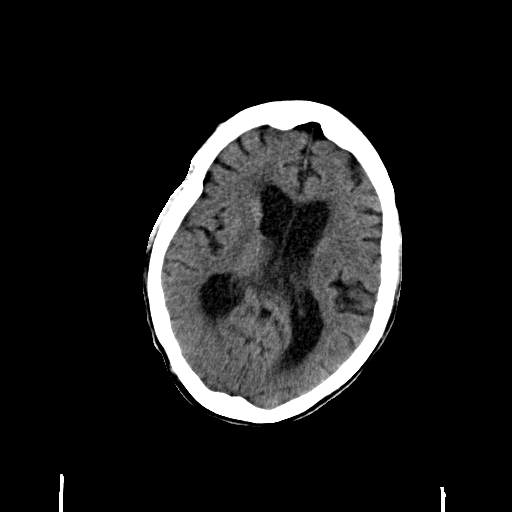
[im 24/32  brain]
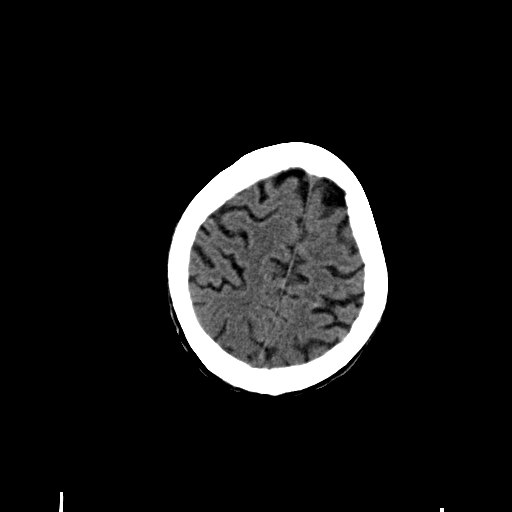

[Series 3: head bone · axial · 0.58mm/px · z∈[+368,+404]mm · 3 of 84 slices shown]
[im 10/84  bone]
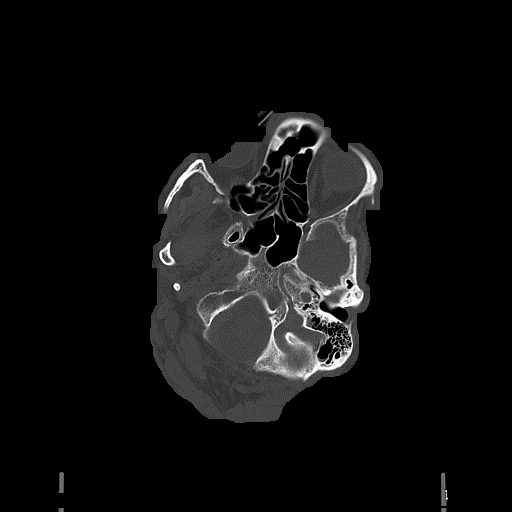
[im 19/84  bone]
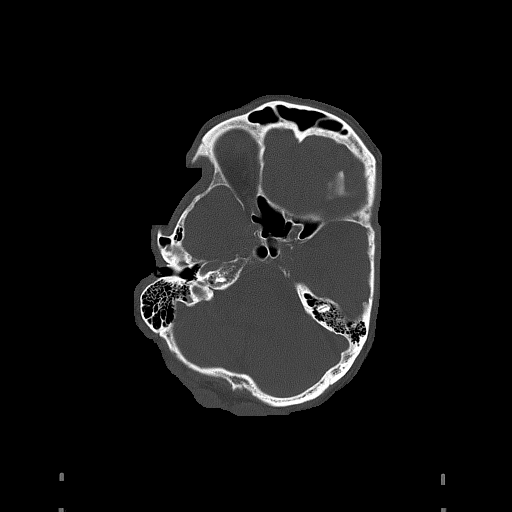
[im 28/84  bone]
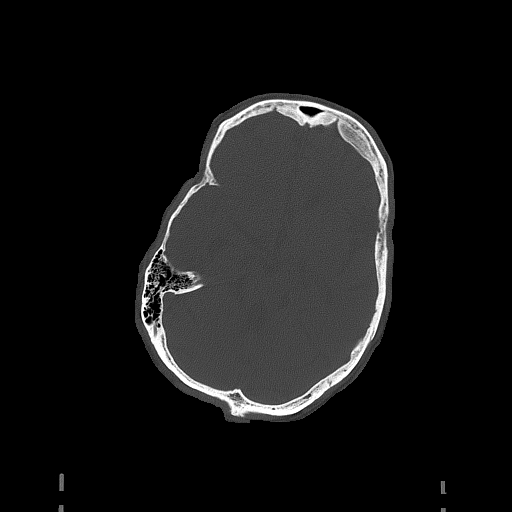

[Series 4: head bone 2 · axial · 0.47mm/px · z∈[+382,+523]mm · 8 of 92 slices shown]
[im 10/92  bone]
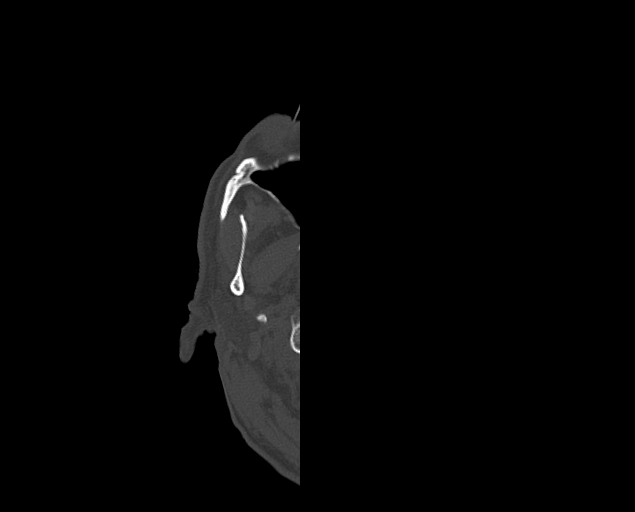
[im 19/92  bone]
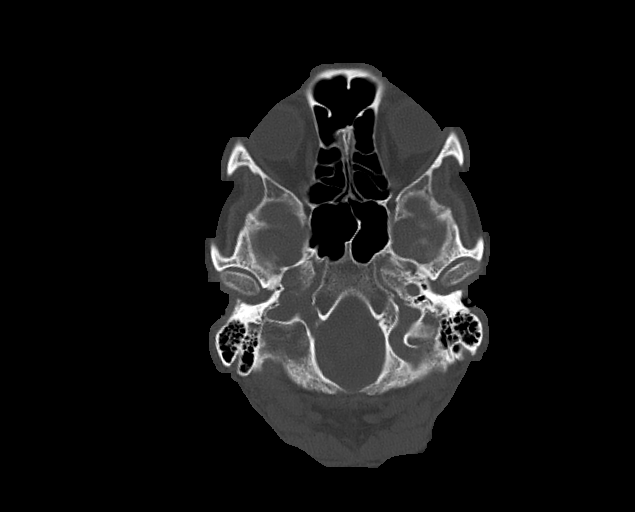
[im 28/92  bone]
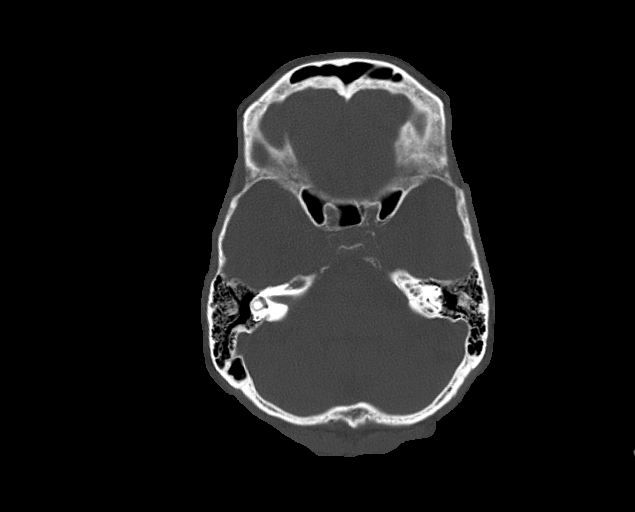
[im 37/92  bone]
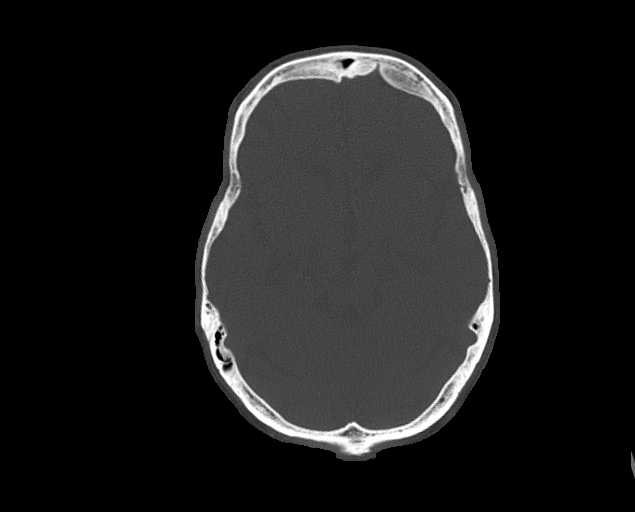
[im 55/92  bone]
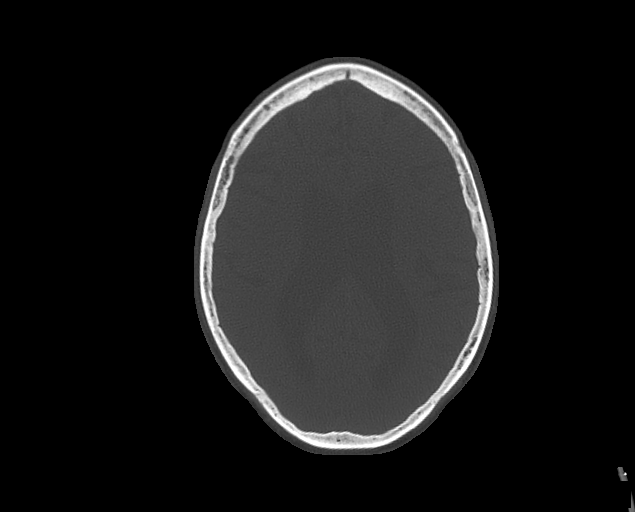
[im 64/92  bone]
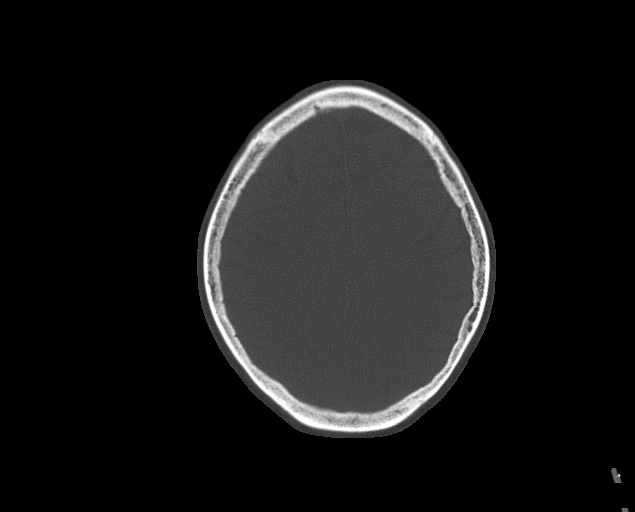
[im 73/92  bone]
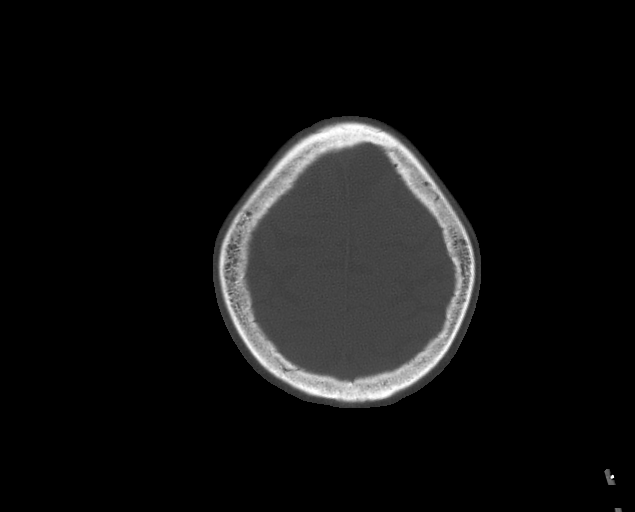
[im 82/92  bone]
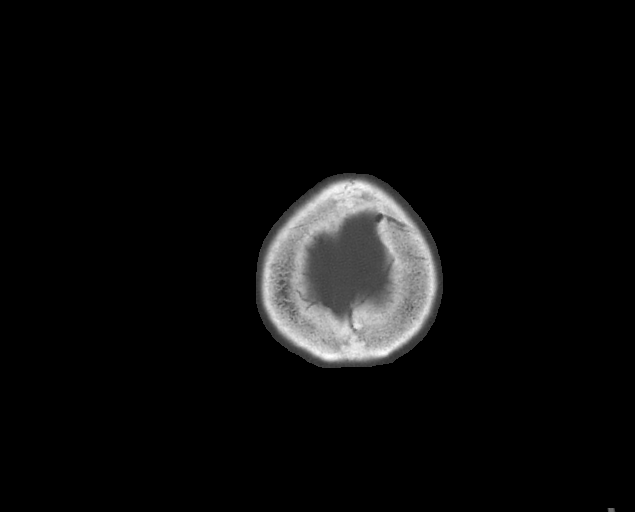

[Series 5: head wo 2 · axial · 0.41mm/px · z∈[+413,+468]mm · 2 of 34 slices shown]
[im 12/34  brain]
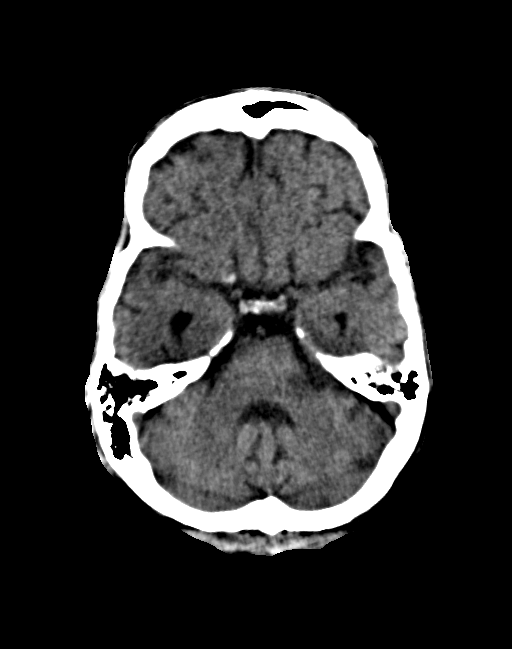
[im 23/34  brain]
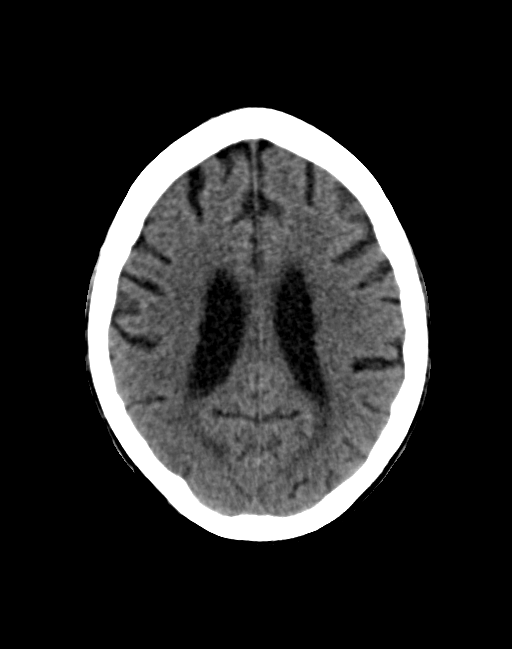

[16 of 30 positions shown; findings below may reference images not displayed]

FINDINGS: Bony calvarium appears intact. Mild diffuse cortical atrophy is
noted. Mild chronic ischemic white matter disease is noted. No mass
effect or midline shift is noted. Ventricular size is within normal
limits. There is no evidence of mass lesion, hemorrhage or acute
infarction.
IMPRESSION: Mild diffuse cortical atrophy. Mild chronic ischemic white matter
disease. No acute intracranial abnormality seen.

## 2015-01-07 NOTE — Discharge Summary (Signed)
PATIENT NAME:  [name], Myrle L MR#:  [id number] DATE OF BIRTH:  1950/10/10  DATE OF ADMISSION:  06/12/2012 DATE OF DISCHARGE:  06/16/2012  PRESENTING COMPLAINT: Nausea, vomiting, diarrhea.   DISCHARGE DIAGNOSES:  1. MSSA septicemia.  2. 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 Orland Jarredroxler.  3. MSSA septicemia.  4. Type II diabetes.  5. Hypertension.  6. Urinary incontinence.   CODE STATUS: FULL CODE.   CONDITION ON DISCHARGE: Stable.   PROCEDURES:  1. PICC line placement 06/16/2012 prior to discharge   2. Left fifth metatarsal head resection by Dr. Orland Jarredroxler on 06/15/2012    CONSULTATIONS:  1. ID consultation with Dr. Leavy CellaBlocker  2. Podiatry consultation with Dr. Orland Jarredroxler   3. Endocrine consultation with Dr. Tedd SiasSolum    MEDICATIONS AT DISCHARGE:  1. Rocephin 2 grams daily for six weeks from 06/15/2012.  2. Simvastatin 20 mg at bedtime.  3. Benztropine 1 mg b.i.d.  4. Bethanechol 25 mg one tablet b.i.d.  5. Metoprolol 25 mg half a tablet twice a day.  6. Remeron 45 mg at bedtime.  7. Levemir 28 units in the morning.  8. Glimepiride 2 mg daily.  9. Aspirin 81 mg daily.  10. Nitrostat 0.4 mg sublingual as needed for chest pain.  11. Gabapentin 100 mg 3 capsules 3 times a day.  12. Acetaminophen/hydrocodone 325/5 one tablet 4 times a day as needed.  13. Colace 100 mg b.i.d.  14. MiraLAX daily.  15. The patient was advised to stop taking Humalog, insulin 70/30, and metformin.   REFERRAL: Home health RN. to follow.   FOLLOW-UP:  1. Follow-up with Dr. Orland Jarredroxler on October 7th at 10:15 a.m.  2. Follow-up with Dr. Leavy CellaBlocker on October 11th at 10 o'clock.  3. Follow-up with Dr. Tedd SiasSolum on October 10th at 10:30 a.m.   WOUND CARE: Per Dr. Alberteen Spindleline of Podiatry wet-to-dry dressing changes daily packed into wound by home health RN.   LABS AT DISCHARGE: Wound culture aerobic and anaerobic fifth metatarsal bone no growth in 8 to 12  hours. No organisms seen so far. Creatinine 1.99. Blood cultures negative in 48 hours. Lipase 40. Hemoglobin and hematocrit 9.6 and 28.6. White count 6.3, platelet count 159. Urinalysis negative for urinary tract infection. Urine culture no growth. Creatinine on admission was 2.14.   MRI of the left foot shows marrow edema within the fifth metatarsal and second metatarsal as described. No definite cortical destruction appreciated. Early changes of osteomyelitis cannot be excluded. Both these findings may represent reactive edema particularly considering the findings within the soft tissue. Findings consistent with cellulitis as described. There is evidence of edema within the musculature.   Hemoglobin A1c 10.2.   BRIEF SUMMARY OF HOSPITAL COURSE: [name] is a pleasant 64 year old Caucasian gentleman with history of type II diabetes, history of kidney stones, gout, history of MI, and depression in the past who was admitted with:  1. SIRS secondary to fever, leukocytosis suspected from Staph aureus septicemia, clear source unknown, could be from the osteomyelitis in the left fifth digit. The patient was initially started on IV Rocephin and IV vancomycin, however, after blood cultures were positive for MSSA was changed to 2 gram IV Rocephin which will cover his MSSA as well as his osteomyelitis of the left fifth digit. The patient will get IV antibiotics through PICC line that will be placed today prior to discharge. He will be followed closely by Dr. Leavy CellaBlocker as outpatient. The patient underwent  left foot fifth metatarsal head resection by Dr. Orland Jarred. Dressing changes as above. Home health will follow the patient for the same.  2. Type II diabetes, poorly controlled. His Glucophage was discontinued given his chronic renal failure. He was seen by Dr. Tedd Sias who recommends Levemir and his glimepiride. He will follow-up with Dr. Tedd Sias as outpatient.  3. Acute on chronic renal failure, improved after IV fluids.  Creatinine trending down 1.99 at discharge.  4. Hypertension. His metoprolol was continued.  5. Diabetic neuropathy. Continue Gabapentin.  6. 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 The patient is on Bethanechol.   7. Hyperlipidemia. On Zocor.  Hospital stay otherwise remained stable. The patient tolerated PT evaluation. No PT recommendations at home. Rolling walker has been prescribed. Hospital stay otherwise was stable.   TIME SPENT: 40 minutes.   ____________________________ Wylie Hail Allena Katz, MD sap:drc D: 06/16/2012 14:08:35 ET T: 06/17/2012 12:43:02 ET JOB#: [id number]  cc: Alithea Lapage A. Allena Katz, MD, <Dictator> Rhona Raider. Troxler, DPM Rosalyn Gess. Blocker, MD A. Wendall Mola, MD Lyndon Code, MD Willow Ora MD ELECTRONICALLY SIGNED 06/28/2012 9:28

## 2015-01-07 NOTE — Consult Note (Signed)
PATIENT NAME:  [name], [name] MR#:  [id number] DATE OF BIRTH:  1951/07/24  DATE OF CONSULTATION:  06/12/2012  REFERRING PHYSICIAN:   CONSULTING PHYSICIAN:  Rhona RaiderMatthew G. Fanny Agan, DPM  REASON FOR CONSULTATION: Diabetic ulceration, left foot, hospitalized because of nausea, vomiting, diarrhea, and leukocytosis as well as uncontrolled blood sugars.   HISTORY OF PRESENT ILLNESS: This is a 64 year old male with multiple medical issues who was brought to the ER with nausea, vomiting, and diarrhea. He states he's had a sore on his left foot for a number of months. He states he has not been getting any specific treatment for it at home. It's been a couple of months since he went to his primary care doctor. He was admitted with elevated white count of greater than 101 and also acute renal failure and uncontrolled blood sugars.    PAST MEDICAL HISTORY:  1. Diabetes. 2. Nephropathy. 3. Gout. 4. Kidney stones. 5. Coronary artery disease with stent placement.  6. Hyperlipidemia.  7. Depression.   PAST SURGICAL HISTORY:  1. Cholecystectomy.  2. Kidney stone surgeries x2. 3. Cardiac catheterization and angioplasty with stenting. 4. Esophageal dilations.  ALLERGIES: Colchicine.   HOME MEDICATIONS:  1. Benztropine 1 mg twice a day. 2. Bethanechol 25 mg twice a day.  3. Aspirin 81 mg daily.  4. Gabapentin 300 mg daily. 5. Glimepiride 2 mg daily.  6. Humalog 35 units b.i.d.  7. Levemir 28 units sub-Q in the morning. 8. Metformin 500 mg b.i.d.  9. Metoprolol 25 mg half tablet b.i.d.  10. Remeron 45 mg at bedtime. 11. Nitrostat 0.4 mg sublingually as needed for chest pain. 12. Simvastatin 20 at bedtime.  13. Dr. Renae GlossWieting has stopped his Glucophage due to his kidney infection and started him on vancomycin and Rocephin at this juncture.   PHYSICAL EXAMINATION:   VITAL SIGNS: Temperature 98.3 at this point, pulse 92, respirations 20, blood pressure 130/74, pulse oximetry 95%.   LOWER EXTREMITY EXAM:  Vascular DP pulses are diminished but palpable bilaterally. Not much hair growth from the knees down.   DERMATOLOGIC: On the left foot there is an ulceration submet 5 that's approximately 2 cm diameter with some undermining but no sinus tracking is evident. It is probably a stage II ulceration down through the skin dermis into the subcutaneous tissue up to the plantar capsule tissue of the fifth metatarsophalangeal joint. I think most of the fat is involved in this plantar area as well but like I said there does not appear to be any tracking. There is some mild erythema just medial to this area on the plantar aspect of the foot and also some mild erythema dorsally. There is not extensive swelling. No evidence of tracking is noted.   X-rays taken today are not indicative of any type of bone involvement as of yet.   CLINICAL IMPRESSION: Diabetic neuropathic ulceration submet 5, uncertain bone involvement.   TREATMENT PLAN: I'm going to debride the hyperkeratotic buildup and reduce some of the undermining tissues and remove some of the fibrous rim with a sharp blade today. This was an excisional type of debridement done with Betadine prep and utilizing sterile disposable 10 blade at bedside. The patient tolerated it well since he is quite neuropathic. Good bleeding was encountered. Central portion of the wound had some mild eschar and I debrided that as well. He had good bleeding across the region with debridement. After the area was copiously irrigated with a wound flush, the area was dressed with a wet-to-dry  dressing including some mild Betadine which was diluted with the wound flush as well. Heavy padded dressing was placed across the area. I instructed the patient not to walk on it anymore than is necessary. Tomorrow I'll get him an OrthoWedge shoe to utilize. Since he does have fever and other problems with likely infection, I would recommend an MRI on this area to evaluate whether or not he's got any  type of underlying osteomyelitis to the region. I'll go ahead and order that.   ____________________________ Rhona Raider. Chea Malan, DPM mgt:drc D: 06/12/2012 16:51:05 ET T: 06/13/2012 09:18:46 ET JOB#: [id number]  cc: Rhona Raider Ninnie Fein, DPM, <Dictator> Epimenio Sarin MD ELECTRONICALLY SIGNED 07/06/2012 15:16

## 2015-01-07 NOTE — Consult Note (Signed)
PATIENT NAME:  [name], [name] MR#:  [id number] DATE OF BIRTH:  02-May-1951  DATE OF CONSULTATION:  06/14/2012  REQUESTING PROVIDER:  Fritzi Mandes, MD CONSULTING PHYSICIAN:  A. Lavone Orn, MD  CHIEF COMPLAINT: Uncontrolled diabetes with diabetic foot ulcer.   HISTORY OF PRESENT ILLNESS: This is a 64 year old male seen in consultation for uncontrolled diabetes. The patient was admitted on 06/12/2012 with nausea, vomiting, and diarrhea as well as fever. He was found to have an elevated white count, and subsequent blood culture showed gram-positive cocci. Infection has been localized to his left foot fifth metatarsal where he has osteomyelitis. The patient is scheduled to undergo resection of the fifth metatarsal head tomorrow at 1 o'clock.   He has longstanding diabetes for the last 7-8 years. Diabetes has been uncontrolled. The patient recalls his A1c in the last 1-2 months was 10%, and prior to this it was around 11%. He follows with Dr. Humphrey Rolls. He has been prescribed a regimen of Levemir 28 units q.a.m. and NovoLog 70/30 mix 30 units b.i.d. in addition to metformin 1000 mg b.i.d. and glimepiride 2 mg daily. However, patient reports noncompliance which he attributes to chronic depression. He states he is only taking 1-2 injections daily, typically just his 70/30 mix of 30 units once in the morning. He is often skipping meals. He is nervous about provoking hypoglycemia with more insulin. He is rarely checking his blood sugars, claims he only checks about 5 times per month. Diabetes is complicated by peripheral neuropathy. He reports chronic numbness and pain in his feet as well as some pain in the hands, right more than left. He has not had any prior amputations. He denies any known history of diabetic retinopathy and last had an eye exam 1 year ago. He denies any blurred vision.   For the last 2 days, patient has received Levemir 28 units at 7 a.m. only. His NovoLog 70/30 mix has been held. He has been on a  NovoLog insulin sliding scale of 4 units if sugar 150 - 204 and 4 additional units for every 50 over 201. Over the last 24 hours, sugars have been in the range of 120-183, and he has received a total of 8 units of NovoLog on his sliding scale. In the previous 24 hours sugars were in the range of 107-148, and he did not require any insulin on his sliding scale. The patient is eating, but reports appetite is poor. Last year he had about a 40-pound weight loss which he attributes to not eating. He had a poor appetite due to depression. He does have a psychiatrist who he follows with in [place].   PAST MEDICAL HISTORY:  1. Diabetes mellitus. 2. Diabetic peripheral neuropathy.  3. Depression.  4. Coronary artery disease, status post PCI with stent placement.  5. Nephrolithiasis with history of kidney stone surgery.  6. History of esophageal stricture, status post dilation.  7. History of cholecystectomy.  8. Hyperlipidemia.  9. History of gout.   INPATIENT MEDICATIONS:  1. Levemir 28 units q.a.m.  2. NovoLog sliding scale.  3. Cogentin 1 mg b.i.d.  4. Urecholine 25 mg b.i.d.  5. Ceftriaxone 2 grams daily.  6. Neurontin 300 mg t.i.d.  7. Lopressor 25 mg b.i.d.  8. Remeron 45 mg at bedtime.  9. Protonix 40 mg daily.  10. Zocor 20 mg at bedtime.  11. Vancomycin 1 gram q.18 hours.  12. Aspirin 81 mg daily.  13. Heparin 5000 units subcutaneous b.i.d.   ALLERGIES: Colchicine  causes nausea/vomiting/diarrhea.   FAMILY HISTORY: Both parents and a brother had diabetes. Father had toe amputations and heart disease.   SOCIAL HISTORY: The patient lives with his daughter who is 70 years old. He denies any tobacco or alcohol use.   REVIEW OF SYSTEMS. GENERAL: Recent weight gain after a 40-pound weight loss last year. He has had fevers. HEENT: Denies blurred vision. Denies sore throat. NECK: No neck pain. No dysphasia. CARDIAC: No chest pain, no palpitation. PULMONARY: No cough. No shortness of  breath. ABDOMEN: No abdominal pain. Appetite is somewhat down. EXTREMITIES: No leg swelling, no motor weakness. NEUROLOGIC: He reports numbness in both feet and pain in the extremities and right hand attributed to neuropathy. SKIN: No rash or pruritus. NEUROLOGIC: No falls. No tremor. HEME: No easy bruisability or recent bleeding.   PHYSICAL EXAMINATION:   VITAL SIGNS: Height 67.9 inches, weight 168 pounds, BMI 25.6. Temperature 98.5, pulse 80, respirations 18, blood pressure 140/83, pulse oximetry 90% on room air.   GENERAL: Well-developed white male in no distress.   HEENT: EOMI. Oropharynx is clear.   NECK: Supple. No thyromegaly.   CARDIAC: Regular rate and rhythm without murmur.   PULMONARY: Clear to auscultation bilaterally. No wheeze. Good inspiratory effort.   ABDOMEN: Diffusely soft, nondistended.   EXTREMITIES: No edema is present. Left foot boot in place, was not removed.   NEUROLOGIC: No tremor of outstretched hands. Decreased sensation to light touch on both feet.   PSYCHIATRIC: Alert and oriented x3, somewhat disheveled, appropriate affect. Good eye contact.   LABORATORY: Glucose 93, BUN 23, creatinine 1.8, sodium 138, potassium 3.9, eGFR 39, hemoglobin A1c 10.2%. Calcium 7.6. Hemoglobin 9.6, hematocrit 28.6, WBC 6.3, platelets 159,000.   ASSESSMENT: This is a 64 year old male with chronically uncontrolled diabetes complicated by diabetic peripheral neuropathy, now with osteomyelitis of the left foot fifth metatarsal and due for resection tomorrow.   RECOMMENDATIONS:  1. Blood sugars are reasonably controlled on Levemir and NovoLog sliding scale alone. I suspect his diabetes is uncontrolled in large part due to noncompliance as his outpatient regimen is significantly more aggressive than what he has been receiving here, and yet A1c is 10.2%. I recommend continuing with his current regimen of Levemir and NovoLog sliding scale. He will be n.p.o. after midnight tonight,  however can continue to receive the Levemir tomorrow, although I will decrease the dose down to just 18 units given the fact that he will be n.p.o. Sliding scale can be continued as well.  2. The patient has good insight into the cause of his uncontrolled diabetes which is depression, and he recognizes need for improved glycemic control. He will continue to work with his psychiatrist in [place] for his depression.  3. Regarding his outpatient diabetes management, I am available for follow up if needed.   Thank you for the kind request for consultation. I will continue to follow along with you.    ____________________________ A. Lavone Orn, MD ams:vtd D: 06/14/2012 17:31:52 ET T: 06/15/2012 08:56:15 ET JOB#: [id number]  cc: A. Lavone Orn, MD, <Dictator> Sherlon Handing MD ELECTRONICALLY SIGNED 06/17/2012 9:28

## 2015-01-07 NOTE — Op Note (Signed)
PATIENT NAME:  [name], [name] MR#:  [id number] DATE OF BIRTH:  1951/09/20  DATE OF PROCEDURE:  06/16/2012  PREOPERATIVE DIAGNOSIS: Staphylococcus osteomyelitis of the foot.    POSTOPERATIVE DIAGNOSIS: Staphylococcus osteomyelitis of the foot.    PROCEDURES:  1. Ultrasound guidance for vascular access to the basilic vein.  2. Fluoroscopic guidance for placement of catheter.  3. Insertion of a  Morpheus single-lumen PICC line right arm.   SURGEON: Renford DillsGregory G. Schnier, MD.  ANESTHESIA: Local.   ESTIMATED BLOOD LOSS: Minimal.   INDICATION FOR PROCEDURE: Requiring antibiotics greater than five days.   DESCRIPTION OF PROCEDURE: The patient's right arm was sterilely prepped and draped, and a sterile surgical field was created. The basilic vein was accessed under direct ultrasound guidance without difficulty with a micropuncture needle and permanent image was recorded. 0.018 wire was then placed into the superior vena cava. Peel-away sheath was placed over the wire. A single lumen peripherally inserted central venous catheter was then placed over the wire and the wire and peel-away sheath were removed. The catheter tip was placed into the superior vena cava and was secured at the skin at 36 cm with a sterile dressing. The catheter withdrew blood well and flushed easily with heparinized saline. The patient tolerated procedure well.  ____________________________ Renford DillsGregory G. Schnier, MD ggs:ap D: 06/16/2012 14:59:41 ET T: 06/16/2012 16:09:12 ET JOB#: [id number] cc: Renford DillsGregory G. Schnier, MD, <Dictator> Renford DillsGREGORY G SCHNIER MD ELECTRONICALLY SIGNED 06/20/2012 14:06

## 2015-01-07 NOTE — Op Note (Signed)
PATIENT NAME:  [name] BentonVANS, Jahaad L MR#:  [id number] DATE OF BIRTH:  March 22, 1951  DATE OF PROCEDURE:  06/15/2012  PREOPERATIVE DIAGNOSIS: Osteomyelitis fifth metatarsal, left foot.   POSTOPERATIVE DIAGNOSIS: Osteomyelitis fifth metatarsal, left foot.   PROCEDURE: Resection of fifth metatarsal, left foot.   SURGEON: Rhona RaiderMatthew G. Jenavive Lamboy, DPM  ASSISTANT: None.   HISTORY OF PRESENT ILLNESS: The patient was admitted to the hospital. He was septic, had high fevers, elevation of white count. The only source of infection was likely the fifth metatarsal head where he had an ulceration on the left foot submet five region. MRI subsequently showed bony edema and erosive changes starting in that fifth metatarsal head. It was felt he had osteomyelitis at that time and would heal faster and better if we resected the bone. This would also alleviate the pressure across here which would allow the chronic ulcer to heal on the plantar aspect of his left foot.   ANESTHESIA: MAC with local.  ESTIMATED BLOOD LOSS: 20 to 25 mL.  TOURNIQUET: None.   OPERATIVE REPORT: The patient was brought to the OR and placed on the OR table in the supine position. At this point after moderate anesthesia care was delivered I was able to deliver a local block with 0.5% Marcaine plain. At this time attention was directed to the fifth metatarsal head where a linear incision was made, deepened with sharp and blunt dissection, bleeders clamped and bovied as required. Capsular tissues were identified and incised longitudinally and reflected away from the medial dorsal lateral aspect of the metatarsal head. Power equipment was used to resect the fifth metatarsal head in toto. The plantar aspect of the metatarsal head was cultured and sent to microbiology for anaerobic and aerobic cultures. The area was then copiously irrigated with sterile saline solution. Area was checked for any tracking to the plantar ulcer and none was identified. At this point  capsular tissue was then closed with 4-0 Vicryl in a continuous stitch. Deep and superficial fascial layers were closed with 4-0 Vicryl continuous stitch. Skin was closed with 4-0 nylon horizontal mattress stitch. The areas were copiously irrigated with each layer of closure. A wet-to-dry saline dressing was placed across the area and then a padded gauze dressing across the wound and the incision as well as the plantar wound.   The patient is to continue taking vancomycin and ceftriaxone until we determine cultures in this bone. I will have Dr. Alberteen Spindleline follow him while he's in the hospital. He should be okay to go home from my standpoint tomorrow as long as he has his PICC line and is followed by Dr. Leavy CellaBlocker and his primary care doctor.   ____________________________ Rhona RaiderMatthew G. Liliani Bobo, DPM mgt:drc D: 06/15/2012 15:20:45 ET T: 06/15/2012 17:25:05 ET JOB#: [id number]  cc: Rhona RaiderMatthew G. Olivianna Higley, DPM, <Dictator> Epimenio SarinMATTHEW G Jumanah Hynson MD ELECTRONICALLY SIGNED 07/06/2012 15:16

## 2015-01-07 NOTE — Consult Note (Signed)
Brief Consult Note: Diagnosis: Diabetic ulcer, uncertain of underlying osteo.  Left submet 5.   Consult note dictated.   Recommend further assessment or treatment.   Orders entered.   Comments: Recommend MRI to evaluate possibility of ostoemyelitis to left 5th metatarsal.  Some mild cellulitis to region, but no tracking or sinus.  Depth of wound go to deep tissues however.  Electronic Signatures: Epimenio Sarinroxler, Collie Wernick G (MD)  (Signed 23-Sep-13 16:54)  Authored: Brief Consult Note   Last Updated: 23-Sep-13 16:54 by Epimenio Sarinroxler, Oddis Westling G (MD)

## 2015-01-07 NOTE — Consult Note (Signed)
PATIENT NAME:  [name], Dhillon L MR#:  [id number] DATE OF BIRTH:  [birth year]  DATE OF CONSULTATION:  06/13/2012  REFERRING PHYSICIAN:  Enedina FinnerSona Patel, MD   CONSULTING PHYSICIAN:  Rosalyn GessMichael E. Matti Killingsworth, MD  REASON FOR CONSULTATION: Fever and Gram-positive cocci in the blood.   HISTORY OF PRESENT ILLNESS: The patient is a 64 year old white man with a past history significant for nephrolithiasis and diabetic neuropathy who was admitted with relatively acute onset of nausea, vomiting and diarrhea as well as fevers and chills. The patient states that this began the night before admission. He had several episodes of emesis. He initially had some abdominal discomfort but this has resolved. He is also having some loose stool. He had noted an ulcer on his left foot approximately 7 to 8 months ago. He has had no pain in that area due to his diabetic neuropathy. He has not been seen for this. He had been having some drainage from the foot but no significant redness, no lymphangitic streaking, and the foot had not been swelling. In the Emergency Room, the patient was having found to have a low-grade fever. He was admitted to the hospital and started on ceftriaxone and vancomycin. He has some long-standing shortness of breath that is unchanged. He is not having significant cough or sputum production. He denies any pus or blood in his stool. He has had some red-tinged urine. He denies any burning or itching, nor any increased frequency.   ALLERGIES: Colchicine.   PAST MEDICAL/SURGICAL HISTORY:  1. Gout.  2. Diabetic neuropathy.  3. Nephrolithiasis.  4. Coronary disease.  5. Hypercholesterolemia.  6. Depression.  7. Status post cholecystectomy.  8. Coronary stenting.  9. Esophageal dilatation.   SOCIAL HISTORY: The patient lives with his daughter. He does not smoke. He does not drink. No history of injecting drug use.   FAMILY HISTORY: Positive for coronary artery disease, prostate cancer and pulmonary embolism.     REVIEW OF SYSTEMS: GENERAL: Positive fevers, chills, malaise. HEENT: No headaches. No sinus congestion. No sore throat. NECK: No stiffness. No swollen glands. RESPIRATORY: Some shortness of breath which is chronic. No cough. No sputum production. CARDIAC: No chest pains. No palpitations. No peripheral edema. GI: Positive nausea and vomiting. Positive diarrhea. He has had some abdominal pain that is now resolved. No melena or bright red blood per rectum. GENITOURINARY: No dysuria. No increased frequency. He has had some redness to the urine, however. MUSCULOSKELETAL: Positive joint pains and myalgias but no frank red, swollen joints. NEUROLOGIC: He has neuropathy in his lower extremities but no focal weakness. PSYCHIATRIC: No complaints. SKIN: No rashes. All other systems are negative.   PHYSICAL EXAMINATION:  VITAL SIGNS: T-max of 101.5, T-current of 100.2, pulse 83, blood pressure 112/68, 92% on room air.   GENERAL: A 64 year old white man in no acute distress.   HEENT: Normocephalic, atraumatic. Pupils are equal and reactive to light. Extraocular motion is intact. Sclerae, conjunctivae, and lids are without evidence for emboli or petechiae. Oropharynx shows no erythema or exudate. Teeth and gums are in fair condition.   NECK: Supple. Full range of motion. Midline trachea. No lymphadenopathy. No thyromegaly.   CHEST: Clear to auscultation bilaterally with good air movement. No focal consolidation.   HEART: Regular rate and rhythm without murmur, rub, or gallop.   ABDOMEN: Soft and nontender and nondistended. No hepatosplenomegaly. No hernia is noted.   EXTREMITIES: No evidence for tenosynovitis. His left foot was wrapped in bandages having recently had surgery at the  bedside by Podiatry. There is no evidence for swelling in the lower extremities. There is no lymphangitic streaking.   SKIN: No rashes. No stigmata of endocarditis, specifically no Janeway lesions nor Osler nodes.   NEUROLOGIC:  The patient is awake and interactive, moving all four extremities.   PSYCHIATRIC: Mood and affect appeared normal.   LABORATORY, DIAGNOSTIC AND RADIOLOGICAL DATA: BUN 23, creatinine 1.85, bicarbonate 22, anion gap of 9.0. White count of 9.8, hemoglobin of 10.0, platelet count 202, ANC of 8.8. LFTs from admission showed an AST of 11, ALT 13, alkaline phosphatase 141, total bilirubin of 0.5. White count on admission was 15.3. Sedimentation rate on admission was 65. Blood cultures are growing Gram-positive cocci in one aerobic bottle of one set and one anaerobic bottle of the other set. A urinalysis from admission had greater than 500 glucose, 1+ ketones, 3+ blood, negative nitrites, negative leukocyte esterase, 267 red cells and 2 white cells. Urine culture shows no growth to date. A chest x-ray from admission shows no infiltrates. A foot x-ray shows no evidence for osteomyelitis.   IMPRESSION: A 64 year old white man with a history of diabetic neuropathy and nephrolithiasis, admitted with nausea, vomiting, fever and diabetic foot ulcer, with Gram-positive cocci in the blood.   RECOMMENDATIONS:  1. No obvious source for his bacteremia. It is possible this is a contaminant. I will await the ID of the Gram-positive cocci. He has no infiltrate on chest x-ray, negative urinalysis except for blood; and Podiatry did not see obvious evidence for infection with the debridement of his foot.  2. I agree with broad-spectrum coverage for now. Ceftriaxone does not provide excellent anaerobic coverage but has some. We will continue the ceftriaxone and the vancomycin.  3. He did not have abdominal pain but had nausea, vomiting and fever. He has no tenderness. I will add a lipase to his labs.  4. We will await the blood culture ID and sensitivity.  5. We will order an MRI of the foot to access for deeper seated bone infection.   This is a moderately complex Infectious Disease case.   Thank you very much for involving  me in Mr. Lauver' care.  ____________________________ Rosalyn Gess. Amya Hlad, MD meb:[id number] D: 06/13/2012 14:36:04 ET T: 06/13/2012 16:13:21 ET JOB#: [id number] cc: Rosalyn Gess. Hartman Minahan, MD, <Dictator> Matelyn Antonelli E Ermagene Saidi MD ELECTRONICALLY SIGNED 06/14/2012 8:26

## 2015-01-07 NOTE — H&P (Signed)
PATIENT NAME:  [name], [name] MR#:  [id number] DATE OF BIRTH:  07/02/1951  DATE OF ADMISSION:  06/12/2012  PRIMARY CARE PHYSICIAN:  [facility] Esperanza SheetsEric Turner, [facility] supervised by Dr. Beverely RisenFozia Khan  CHIEF COMPLANT:  Nausea, vomiting, and diarrhea.  HISTORY OF PRESENT ILLNESS: The patient is a 64 year old man with multiple medical issues who presents to the ER with nausea, vomiting, and diarrhea starting last night. He vomited 4 to 5 times last night. Diarrhea is light brown. Some abdominal discomfort. He has had a bad foot sore that has been going on for months, getting worse as per the family. His sugars have been up and he has had a fever over 101 recently. He was on antibiotics for a urinary tract infection and he has trouble urinating on bethanechol. In the Emergency Room, the patient was found to have an elevated white count, a fever of greater than 101, and to be in acute renal failure with uncontrolled diabetes. Hospitalist services were contacted for further evaluation.   PAST MEDICAL HISTORY:  1. Diabetes.  2. Neuropathy.  3. Gout.  4. Kidney stones.  5. Coronary artery disease.  6. Hyperlipidemia.  7. Depression.   PAST SURGICAL HISTORY:  1. Cholecystectomy.  2. Two kidney stone surgeries.  3. Stent in the heart. 4. Esophageal dilatations.   ALLERGIES: Colchicine.   MEDICATIONS:  1. Benztropine 1 mg twice a day. 2. Bethanechol 25 mg twice a day.  3. Aspirin 81 mg daily.  4. Gabapentin 300 mg 3 times a day.  5. Glimepiride 2 mg daily.  6. Humalog 35 units  twice a day.  7. Levemir 28 units subcutaneous injection in the morning.  8. Metformin 500 mg 2 tablets twice a day.  9. Metoprolol 25 mg, half tablet twice a day.  10. Remeron 45 mg at bedtime.  11. Nitrostat 0.4 mg sublingually as needed for chest pain.  12. Simvastatin 20 mg at bedtime.   SOCIAL HISTORY: No smoking. No alcohol. No drug use.  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   FAMILY HISTORY: Mother  died at 3684 of a myocardial infarction. Father died of a PE and he had heart disease at an early age starting at age 64. Brother with prostate cancer.     REVIEW OF SYSTEMS: CONSTITUTIONAL: Positive for fever, positive for chills. Positive for weight loss, massive, and then started to gain back 10 pounds recently. Positive for fatigue.  EYES: He does wear glasses and has dry eyes. EARS, NOSE, MOUTH, AND THROAT: Positive for runny nose. Positive for sinus drainage and postnasal drip. CARDIOVASCULAR: No chest pain. No palpitations. RESPIRATORY: Positive for shortness breath with limited movement, coughing up clear phlegm. No hemoptysis. GASTROINTESTINAL: Positive for nausea. Positive for vomiting dark material, occasional abdominal pain. Positive for diarrhea, light brown stool. No bright red blood per rectum. No melena. GENITOURINARY: Positive for burning on urination and difficulty urinating. MUSCULOSKELETAL: Positive for joint pain and feet pain. NEUROLOGIC: Positive for neuropathy. PSYCHIATRIC: Positive for depression. ENDOCRINE: No thyroid problems. HEMATOLOGIC/LYMPHATIC: No anemia. No easy bruising or bleeding.   PHYSICAL EXAMINATION:  VITAL SIGNS: Temperature 101.3, pulse 99, respirations 18, blood pressure 111/68, pulse oximetry 93% on room air.   GENERAL: No respiratory distress.   EYES: Conjunctivae and lids normal. Pupils equal, round, and reactive to light. Extraocular muscles intact. No nystagmus.   EARS, NOSE, MOUTH, AND THROAT: Tympanic membranes no erythema. Nasal mucosa no erythema. Throat no erythema. No exudate seen. Lips and gums no  lesions.   NECK: No JVD. No bruits. No lymphadenopathy. No thyromegaly. No thyroid nodules palpated.   LUNGS: Lungs are clear to auscultation. No use of accessory muscles to breathe. No rhonchi, rales, or wheeze heard.   CARDIOVASCULAR: S1, S2 normal. No gallops, rubs, or murmurs heard. Carotid upstroke 2+ bilaterally. No bruits.  Dorsalis pedis pulses  2+ bilaterally. No edema of the lower extremities.    ABDOMEN: Soft, nontender. No organomegaly/splenomegaly. Normoactive bowel sounds. No masses felt.   LYMPHATIC: No lymph nodes in the neck.   MUSCULOSKELETAL: No clubbing, edema, or cyanosis.   SKIN: On the back there is a mole that is black, slightly irregular. Family did not know when that appeared. I recommended a dermatology appointment as outpatient. Going to the left foot, there is an ulcer about a quarter size with some drainage there. It does not have any necrotic material or surrounding erythema.   NEUROLOGIC: Cranial nerves II through XII grossly intact. Deep tendon reflexes 1+ bilateral lower extremities. Sensation in the toes and bottom of the foot decreased by light touch.   PSYCHIATRIC: The patient is oriented to person, place, and time.   LABORATORY, DIAGNOSTIC, AND RADIOLOGICAL DATA: EKG shows sinus tachycardia at 113 beats per minute, Q waves septally. Urinalysis: 3+ blood, otherwise negative. White blood cell count 15.3, hemoglobin and hematocrit 11.9 and 35.1, platelet count 234, glucose 512, BUN 32, creatinine 2.14, sodium 138, potassium 4.3, chloride 104, CO2 22, calcium 8.7. Liver function tests normal range except for alkaline phosphatase, which is slightly elevated at 141, albumin slightly low at 3.2, GFR 32.   ASSESSMENT AND PLAN:  1. Systemic inflammatory response syndrome with fever and leukocytosis. Unclear etiology of where the infection is. Could be a foot infection but the foot does not look too bad with this ulcer, wondering if there is any underlying infection there. We will have podiatry look at this and we will start off with an x-ray of the left foot. We will give empirically vancomycin and Rocephin at this point in time. Since the patient did have diarrhea, could be a viral infection but we will send off stool studies since the patient recently had antibiotics to rule out C. difficile. We will send off a urine  culture since the patient has trouble urinating and blood cultures.  2. Diabetes, poorly controlled. We will hold the Glucophage since that is contraindicated secondary to acute renal failure. Continue sliding scale and Levemir at this time. We will give IV fluid hydration.  3. Acute renal failure.  We will give IV fluid hydration, hold Glucophage at this time.  4. Hypertension and coronary artery disease. Continue metoprolol.  5. Neuropathy. Continue gabapentin, p.r.n. pain medication.  6. Trouble urinating on bethanechol.    7. Hyperlipidemia. Continue Zocor.  8. Mole on the back. Recommend dermatology as outpatient to follow this.  CODE STATUS:  FULL CODE.  TIME SPENT ON ADMISSION: 55 minutes.     ____________________________ Herschell Dimes. Renae Gloss, MD rjw:[id number] D: 06/12/2012 14:04:35 ET T: 06/12/2012 14:35:50 ET JOB#: [id number]  cc: Herschell Dimes. Renae Gloss, MD, <Dictator> [facility] Salley Scarlet MD ELECTRONICALLY SIGNED 06/20/2012 21:23

## 2015-01-07 NOTE — Consult Note (Signed)
Chief Complaint and History:   Referring Physician Dr. Allena KatzPatel    Chief Complaint uncontrolled diabetes   Allergies:  Colchicine: N/V/Diarrhea  Assessment/Plan:   Assessment/Plan Patient was seen, examined and chart was reviewed. He is a 64 yo M with a 7 yr h/o diabetes manged with insulin, metformin and glimepiride. Diabetes is uncontrolled and complicated by peripheral neuropathy. Patieht was admitted with fever, leukocytosis, and N/V and found to have left foot osteomyelitis. He will have resection of the 5th MT head tomorrow at 1PM with Dr. Orland Jarredroxler. Sugars are currently well controlled and in the 100-180 range on levemir 28 units qAM and NovoLog SSI qACHS.  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 he will be NPO after midnight tonight and I will reduce the Lvemir to 20 units tomorrow AM only. Continue NovoLog SSI. Resume the Levemir 28 units on morning of 9/26. I will follow with you.  Full consult has been dictated.   Electronic Signatures: Raj JanusSolum, Anna M (MD)  (Signed 25-Sep-13 17:35)  Authored: Chief Complaint and History, ALLERGIES, Assessment/Plan   Last Updated: 25-Sep-13 17:35 by Raj JanusSolum, Anna M (MD)

## 2015-01-07 NOTE — Consult Note (Signed)
Impression: 64yo WM w/ h/o diabetic neuropathy, nephrolithiasis admitted with nausea, vomiting, fever and a diabetic foot ulcer who has GPC in the blood.  No obvious source for his bacteremia.  It is possible that this is a contaminant.  Will await the ID of the GPC.  He has no infiltrate on CXR, negative u/a except for blood and podiatry did not see obvious evidence for infection with their debridement of his foot. Agree with broad spectrum coverage for now.  Ceftriaxone does not provide excellent anaerobic coverage, but has some. He did not have abd pain, but had N/V and fever.  No tenderness.  Will add lipase to am labs. Await BCx ID and sensitiviy. Agree with MRI of the foot to assess for deeper seated bone infection.   Electronic Signatures: Marvin Grabill, Rosalyn GessMichael E (MD) (Signed on 24-Sep-13 14:27)  Authored   Last Updated: 24-Sep-13 14:28 by Armarion Greek, Rosalyn GessMichael E (MD)

## 2015-01-10 NOTE — Consult Note (Signed)
PATIENT NAME:  [name], [name] MR#:  [id number] DATE OF BIRTH:  1951/09/18  NEPHROLOGY CONSULTATION  DATE OF CONSULTATION:  01/15/2013  REQUESTING PHYSICIAN:  Dr. Abel Presto.  CONSULTING PHYSICIAN:  Zackeriah Kissler Lilian Kapur, MD  REASON FOR CONSULTATION:   Acute renal failure  HISTORY OF PRESENT ILLNESS: The patient is a pleasant 64 year old Caucasian male with a past medical history of diabetes mellitus, peripheral neuropathy, urinary retention with a neurogenic bladder, history of MSSA septicemia secondary to left foot osteomyelitis and depression who presented to the [facility] with lower abdominal pain and urinary retention. He reported that he has been having dysuria that has been ongoing for at least 1 week. He has also had some recent nausea and vomiting. We are consulted for the evaluation and management of acute renal failure.   The patient's creatinine upon presentation was found to be 2.08. However the patient also appears to have underlying chronic kidney disease with a baseline creatinine of 1., and an EGFR of 50. He has long-standing diabetes mellitus. He is currently being treated for a presumed urinary tract infection. He has been seen by urology, and a Foley catheter was recommended to stay in place until outpatient followup. Urinalysis showed pyuria and hematuria. Urine protein was found to be 30 mg/dL. The patient did not appear to be on any obvious nephrotoxins at home.   PAST MEDICAL HISTORY: 1.  Diabetes mellitus.  2.  Peripheral neuropathy.  3.  Urinary retention, with neurogenic bladder.  4.  History of MSSA septicemia, secondary to left foot osteomyelitis.  5.  Chronic kidney disease, stage III, baseline EGFR of 50.  6.  Depression.   ALLERGIES: COLCHICINE.   CURRENT INPATIENT MEDICATIONS:  Include:   1.  Acetaminophen 650 mg p.o. q. 4 h. p.r.n. 2.  Norco 5/325, 1 to 2 tablets p.o. q. 4 hours p.r.n. 3.  Gabapentin 100 mg p.o. t.i.d. 4.  Amaryl 4  mg p.o. daily.  5.  Heparin 5000 units subQ q.12 hours,  6.  Novolog 10 units subQ q.a.c. with supper.  7.  Levimir 25 units subcutaneous q. 12 h.  8.  Sliding-scale insulin.  9.  Remeron 45 mg p.o. at bedtime.  10.  Zofran 4 mg IV q.4 h. p.r.n.  11.  Flomax 0.4 mg p.o. daily.  12.  Ceftriaxone 1 gram IV q. 24 hours.  13.  Milk of magnesia 30 mL p.o. x 1.  14.  Potassium chloride 40 mEq p.o. x 1.   SOCIAL HISTORY: The patient lives in [place]. He is divorced. He has 2 adult children. He been on disability for at least 5 years. He used to work as a Company secretary. He denies tobacco, alcohol, or illicit drug use.   FAMILY HISTORY: Mother and father both deceased secondary to myocardial infarction.   REVIEW OF SYSTEMS:  CONSTITUTIONAL: Reports feeling subjectively warm at home. Denies weight loss.  EYES: Denies diplopia, blurry vision.  HEENT: Denies headaches, hearing loss. Denies epistaxis.  CARDIOVASCULAR: Denies chest pain, palpitations, PND.  RESPIRATORY: Denies cough, shortness of breath, hemoptysis.  GASTROINTESTINAL: Has had some nausea and vomiting. Denies abdominal pain.  GENITOURINARY: Reports dysuria and some cloudy urine at home.  MUSCULOSKELETAL: Denies joint pain, swelling, or redness.  INTEGUMENTARY: Denies skin rashes or lesions.  NEUROLOGIC: Denies focal weakness. Does have peripheral neuropathy.  PSYCHIATRIC: Has underlying depression.  ENDOCRINE: Denies polyuria, polydipsia. Does have underlying diabetes mellitus. HEMATOLOGIC/LYMPHATIC: Denies easy bruisability, bleeding, or swollen lymph nodes. ALLERGIC/IMMUNOLOGIC: Denies seasonal allergies  or history of immunodeficiency.   PHYSICAL EXAMINATION: VITAL SIGNS: Temperature 97.9, pulse 86, respirations 16, blood pressure 125/79, pulse ox 96% on room air.  GENERAL: Reveals a slender Caucasian male who appears his stated age, currently in no acute distress.  HEENT: Normocephalic, atraumatic. Extraocular movements are intact.  Pupils equal, round, and reactive to light. No scleral icterus. Conjunctivae are pink. No epistaxis noted. Gross hearing intact. Oral mucosa moist.  NECK: Supple, without JVD or lymphadenopathy.  LUNGS: Clear to auscultation bilaterally, with normal respiratory effort.  CARDIOVASCULAR: S1, S2, regular rate and rhythm. No murmurs, rubs, or gallops appreciated.  ABDOMEN: Soft, nontender, nondistended. Bowel sounds positive. No rebound, no guarding, no gross organomegaly appreciated.  EXTREMITIES: No clubbing, cyanosis, or edema.  NEUROLOGIC: The patient is alert and oriented to time, person, and place. Strength is 5/5 in both upper and lower extremities.  GENITOURINARY: Foley catheter noted to be in place currently. No suprapubic tenderness elicited.  PSYCHIATRIC: The patient appears to have a depressed affect, but appears to have good insight into his current illness.   LABORATORY DATA: Sodium 138, potassium 3.3, chloride 102, CO2 28, BUN 26, creatinine 1.83, glucose of 116.   Total protein 8.1, albumin 3.1, total bilirubin 0.5, alkaline phosphatase 170, AST 12, ALT 11.   CBC shows WBC 9.3, hemoglobin 11.5, hematocrit 32, platelets 239.   Urine culture thus far is negative. Urinalysis shows urine glucose greater than 500 mg/dL, protein 30 mg/dL, nitrites positive, 12 RBCs per high-power field, 64 WBCs per high-power field.   IMPRESSION: This is a 64 year old Caucasian male with a past medical history of diabetes mellitus, diabetic neuropathy, chronic kidney disease stage III, urinary retention secondary to neurogenic bladder, emesis, septicemia, left foot osteomyelitis, who presented to [facility] with urinary retention and a urinary tract infection:   1.  Acute renal failure/chronic kidney disease, stage III: Likely related to underlying urinary retention as well as urinary tract infection: We will start the patient on IV fluid hydration with 0.9 normal saline at 50 mL per  hour. The patient had a renal ultrasound performed which was negative for hydronephrosis. Will recommend avoiding any further nephrotoxins or IV contrast at this time as he appears to have recovering acute renal failure. We will continue to monitor the patient's renal function over the course of the hospitalization. We will also check SPEP and UPEP.  2.  Urinary retention/urinary tract infection. The patient has a longstanding history of urinary retention. He is now having dysuria. Will await urine culture. Agree with use of ceftriaxone. Urology also following.  3. Anemia, not otherwise specified: Could be potentially secondary to anemia of chronic kidney disease. Hemoglobin 11.5, with an MCV of 87. We will check SPEP, UPEP, and serum iron studies.   I would like to thank Dr. Verdell Carmine for this kind referral. Further plan as the patient progresses.    ____________________________ Tama High, MD mnl:[id number] D: 01/15/2013 14:45:57 ET T: 01/15/2013 15:10:38 ET JOB#: [id number]  cc: Tama High, MD, <Dictator> Mariah Milling Timarion Agcaoili MD ELECTRONICALLY SIGNED 01/19/2013 21:06

## 2015-01-10 NOTE — Consult Note (Signed)
Patient seen, films reviewed, chart reviewed, note dictated. Chronic urinary retention, nephrolithiasis, chronic renal failure The exact etiology of the chronic urinary retention is uncertain.  It appears he has undergone [facility] [address]  He was supposed to be on self intermittent catheterization.  He has not been performing.  His initial bladder scans did appear to be acceptable.  Any degree of dehydration however can't make any difference.  The long-standing urinary retention is likely a factor in the bladder wall thickening.  Acute cystitis is also a factor.  There is little concern over the renal calculi.  There are at least 2 small stones within the left kidney.  This is very different than implied by the ultrasound.  There is no evidence of renal mass or other significant abnormalities.  This does not need any further evaluation or intervention at present.  On CT scan, his bladder expands to his umbilicus.  With his history and the current infection, a Foley catheter is strongly recommended for at least 10 days to allow bladder decompression.  He needs to be on Flomax 0.4 mg daily.  The bethanechol by itself is not sufficient.  He will need a catheter removal and voiding trial within 10-14 days.  This can be performed on an outpatient basis.  He will likely need further evaluation if records from his previous physician cannot be obtained.  Chronic Foley catheter or intermittent catheterization may be necessary.  Potential implications of an discussed with the patient.  There is no indication for any further urological intervention at present.  If there are any further questions, please free to contact us.  Electronic Signatures: Smith Robertope, Roxy Filler S (MD)  (Signed on 03-Jul-14 17:11)  Authored  Last Updated: 03-Jul-14 17:11 by Smith Robertope, Elesa Garman S (MD)

## 2015-01-10 NOTE — Consult Note (Signed)
PATIENT NAME:  [name], [name] MR#:  [id number] DATE OF BIRTH:  02-18-1951  DATE OF CONSULTATION:  03/28/2013  CONSULTING PHYSICIAN:  A. Lavone Orn, MD  REQUESTING PHYSICIAN: Gladstone Lighter, M.D.   CHIEF COMPLAINT: Uncontrolled diabetes.   HISTORY OF PRESENT ILLNESS: This is a 64 year old male with a long history of uncontrolled diabetes complicated by autonomic and peripheral neuropathy who was admitted on 07/02 for urinary retention. He has had a workup and urinary retention has been attributed to his neuropathy. He has an indwelling Foley at this time. He was found to have a urinary tract infection on admission, which has been treated. He also had confusion on admission attributed to metabolic encephalopathy, which has apparently since normalized. His prescribed outpatient diabetes regimen includes Levemir 25 units b.i.d. and glimepiride 4 mg daily. He has not been adherent to this regimen in quite some time. He states he takes medications on occasion, but he does not have ever check his blood sugars. Initial blood glucose on admission was 240. On 07/07, he had an episode of hypoglycemia. Levemir 25 units with glimepiride 4 mg had been given that morning; at lunch, blood sugar was 239 and additionally, he was given Regular insulin 4 units and by 3 p.m. his blood sugar had dropped to 25. Since that time, scheduled Levemir, glimepiride, as well as Regular insulin sliding scale have been held. In the last 48 hours, blood sugars have been in the range of 78 to 216 on fingerstick blood sugar checks.   PAST MEDICAL HISTORY: 1. Type 2 diabetes.  2. Diabetic peripheral neuropathy.  3. Diabetic autonomic neuropathy with neurogenic bladder.  4. History of nephrolithiasis.  5. History of bipolar disorder.  6. History of recurrent urinary tract infection.   OUTPATIENT MEDICATIONS: 1. Glimepiride 4 mg daily.  2. Levemir 25 units subcutaneous daily.  3. Gabapentin 100 mg t.i.d.  4. Benztropine 1 mg  b.i.d.  5. Bethanechol 25 mg b.i.d.   SOCIAL HISTORY: The patient lives with a daughter and granddaughter. Does not smoke or drink alcohol.   FAMILY HISTORY: Positive for heart disease and pancreatic cancer.   ALLERGIES: COLCHICINE.   REVIEW OF SYSTEMS:   GENERAL: No fever. No weight loss.   HEENT: Denies blurred vision. Denies sore throat.   NECK: Denies neck pain, denies dysphasia.   CARDIAC: Denies chest pain. Denies palpitation.   PULMONARY: Denies cough and shortness of breath.   ABDOMEN: Reports good appetite. Denies abdominal pain.   EXTREMITIES: Denies leg swelling.   SKIN: Denies rash or pruritus.   ENDOCRINE:  Denies heat or cold intolerance.   GENITOURINARY: Foley catheter is in place. The patient denies dysuria.   PHYSICAL EXAMINATION:  VITAL SIGNS: Height 67.9 inches, weight 132 pounds, BMI 20.2, temperature 98.9, pulse 94, respirations at 20, pressure 112/68, pulse oximetry 94% on room air.   GENERAL: A white male in no acute distress.   NEUROLOGIC: The patient has ongoing, near continuous movements of all extremities as well as periodic lip smacking. Normal motor tone.   HEENT: Extraocular movements are intact.   OROPHARYNX: Clear.   NECK: Supple. No thyromegaly.   CARDIAC: Regular rate and rhythm. No carotid bruit.   LUNGS: Clear to auscultation bilaterally. No cough.   ABDOMEN: Diffusely soft, nontender. Positive bowel sounds.   EXTREMITIES: No edema is present.   SKIN: No rash or dermatopathy is noted. No acanthosis nigricans.   LABORATORY DATA: Glucose 80, creatinine 1.46, sodium 140, potassium 4.0, chloride 104, eGFR 51. Hemoglobin  A1c 10.1% hematocrit 32.4%.   ASSESSMENT: A 64 year old male with long-standing diabetes complicated by diabetic peripheral and autonomic neuropathy.   RECOMMENDATIONS: 1. Sugars at this time are fairly well controlled despite use of only Regular insulin sliding sale . Would recommend restarting glimepiride 2  mg each day, given at breakfast.  2. Would continue to hold Levemir at this time.  3. Would adjust insulin sliding scale, to begin once blood sugars are greater than 200. Give 2 units per sugar of 50 over a target of 200 before meals and at bedtime.  4. Will arrange for outpatient followup in about 3 to 4 weeks. I anticipate he will be discharged to rehab.  5. Noted to have involuntary movements of the extremities and I question whether he could have tardive dyskinesia. It may be worth having psychiatry and/or neurology evaluate either as inpatient or outpatient.   Thank you for the kind request for consultation.   ____________________________ A. Lavone Orn, MD ams:rw D: 03/28/2013 13:09:02 ET T: 03/28/2013 14:18:30 ET JOB#: [id number]  cc: A. Lavone Orn, MD, <Dictator> Sherlon Handing MD ELECTRONICALLY SIGNED 04/01/2013 8:54

## 2015-01-10 NOTE — Consult Note (Signed)
Brief Consult Note: Diagnosis: Dysphagia.   Patient was seen by consultant.   Consult note dictated.   Recommend to proceed with surgery or procedure.   Recommend further assessment or treatment.   Orders entered.   Discussed with Attending MD.   Comments: EGD with possible esophageal dilation +/- biopsies with Dr Servando SnareWohl tomorrow NPO after MN Pt's levimir adjusted for procedure tonight's dose 1/2 usual Hold AM dose levimir Protonix 40mg  daily Discussed w/ Dr Servando SnareWohl.  Electronic Signatures: Joselyn ArrowJones, Zamier Eggebrecht L (NP)  (Signed 29-Apr-14 10:15)  Authored: Brief Consult Note   Last Updated: 29-Apr-14 10:15 by Joselyn ArrowJones, Marqueze Ramcharan L (NP)

## 2015-01-10 NOTE — Discharge Summary (Signed)
PATIENT [place], [name] MR#:  [id number] DATE OF BIRTH:  Nov 09, 1950  DATE OF ADMISSION:  01/14/2013 DATE OF DISCHARGE:  01/18/2013  CONSULTANTS: 1.  Dr. Cherylann Ratel from nephrology.  2.  Dr. Servando Snare from GI.  3.  Dr. Evelene Croon from urology.   CHIEF COMPLAINT:  Lower abdominal pain, urinary retention.   PRIMARY CARE PHYSICIAN:  Dr. Beverely Risen.   DISCHARGE DIAGNOSES: 1.  Urinary retention.  2.  Urinary tract infection.  3.  Acute on chronic renal failure.  4.  Dysphagia from esophageal stricture and tortuous esophagus, status post EGD and dilation as well as some localized hemorrhagic gastropathy.  5.  History of diabetes.  6.  Diabetic neuropathy.  7.  History of urinary retention with neurogenic bladder.    8.  History of methicillin-sensitive staphylococcus aureus septicemia secondary to left foot toe osteomyelitis.  9.  Vitamin D deficiency.  DISCHARGE MEDICATIONS:  1.  Benztropine 1 mg 2 times a day.  2.  Bethanechol 25 mg 2 times a day.  3.  Mirtazapine 45 mg at bedtime.  4.  Glimepiride 4 mg at lunch.  5.  Gabapentin 100 mg 3 times a day.  6.  Levemir 25 units 2 times a day.  7.  NovoLog 10 units once a day at supper.  8.  Tamsulosin 0.4 mg once a day.  9.  Augmentin 1 tab every 12 hours for complete treatment duration of 10 days.    The patient will be getting discharged with a Foley to leg bag.   DIET:  Low fat, low cholesterol, ADA diet.   ACTIVITY:  As tolerated.   FOLLOW-UP:  Please follow with PCP and urologist within 1 to 2 weeks.  Please follow with nephrologist within a week.   LABORATORY AND IMAGING STUDIES:  Initial creatinine was 2.08, sodium 131, BUN 27.  Creatinine by discharge was 1.61, hemoglobin A1c was 11.5.  Lipase on arrival was 55.  LFTs, alkaline phosphatase 170, AST 12, ALT 11, albumin 3.1.  Initial WBC 12.  Initial hemoglobin 12.9 and platelets 277.  Urine cultures did grow enterococcus susceptible to ampicillin and Unasyn.  Blood cultures were no  growth to date.  Urine creatinine was 127, protein to creatinine ratio is elevated at 958 and random urine protein was 122.  UA was suggestive of an infection or UTI, 2+ blood, positive nitrites, 1+ leukocyte esterase, and 64 WBC, trace bacteria.  Vitamin D level was low at 9.7.  PTH was 65.   HISTORY OF PRESENT ILLNESS AND HOSPITAL COURSE:  For full details of H and P please see the dictation by Dr. Cherlynn Kaiser on 01/14/2013, but briefly this is a pleasant 64 year old male who came in with abdominal pain and urinary retention ongoing for about a week or so.  He had seen urgent care physician a month earlier and was treated for UTI with some urinary retention.  He does not have a regular urologist, but does have history of prostate problems.  He had a bladder scan done and the Foley was placed with about 700 mL urine drained.  He had a positive UA and therefore was admitted to the hospitalist service.  He had been complaining of some weakness and some nonbloody bilious vomitus in the last week as well.  He was seen by nephrology and Dr. Evelene Croon.  The Foley catheter again was placed which was draining and UTI turned out to be enterococcus and based on sensitivities the antibiotics were changed.  Initially he  was on Rocephin, then was transitioned into Unasyn and was discharged with Augmentin.  He has no UTI symptoms at this point.  He should follow with urology in about a week.  Flomax was started.  His renal failure did improve and the patient was followed by nephrology.  He had a renal ultrasound normal except for the right kidney stone.  He should follow with nephrology as an outpatient in regards to his CKD.  His Levemir was continued.  He did have some dysphagia and therefore underwent barium swallow showing mid esophageal dilation with pooled secretions and therefore GI was consulted and the patient underwent an EGD.  A stricture was found which was dilated.  The patient also does have tortuous esophagus.  At this  point he is eating well without any significant dysphagia.  Neurontin was continued.  At this point he will be discharged with outpatient follow-up with his physicians described above.  The patient is a FULL CODE.    ____________________________ [name] EatonShayiq Zonnique Norkus, MD sa:ea D: 01/19/2013 16:30:00 ET T: 01/20/2013 07:00:41 ET JOB#: [id number]  cc: [name] CodeFozia M. Khan, MD [name] ConnersMichael R. Evelene CroonWolff, MD [name] EatonShayiq Javontay Vandam, MD, <Dictator>  [name] EatonSHAYIQ Alexismarie Flaim MD ELECTRONICALLY SIGNED 02/12/2013 15:09

## 2015-01-10 NOTE — Consult Note (Signed)
PATIENT NAME:  [name], [name] DATE OF BIRTH:  1951-05-10  DATE OF CONSULTATION:  01/16/2013  REFERRING PHYSICIAN:  Hilda LiasVivek Sainani, MD CONSULTING PHYSICIAN:  Joselyn ArrowKandice L. Finnley Lewis, NP  PRIMARY CARE PHYSICIAN:  Beverely RisenFozia Khan, MD  REASON FOR CONSULTATION:  Dysphagia.  HISTORY OF PRESENT ILLNESS:  [name] is a 64 year old Caucasian male who is admitted with lower abdominal pain secondary to urinary retention for 1 week.  He has been seen by urology, Dr. Evelene CroonWolff, and has been started on treatment.  He also was diagnosed with a urinary tract infection.  Incidentally, he mentions that he has had a 1 week history of nausea and vomiting.  Vomiting has been transient, small volume.  For the last couple of years, he describes dysphagia. He feels as though his food gets "stuck" in his upper esophagus or retrosternally.  He does have heartburn on a regular basis, however, he takes over-the-counter p.r.n. Zantac only.  He does [place] but cannot remember results.  He does think he has had his esophagus dilated years ago.  He is diabetic. Reports his blood sugars were poorly controlled at home, but have been much better since admission. He denies odynophagia.  His weight has remained stable.  His appetite is good. He has been started on mechanical soft diet. He had barium swallow 04/28 which showed a large amount of secretions with a distended esophagus.  A 04/28 CT showed mid to distal esophageal thickening, question esophagitis.  He has had breakfast this morning consisting of eggs.    PAST MEDICAL HISTORY:  Diabetes mellitus, diabetic neuropathy, urinary retention and frequency with neurogenic bladder, depression, anxiety, and nervous breakdown, MRSA septicemia of the left foot with osteomyelitis, chronic renal insufficiency, anemia of chronic disease with a baseline hemoglobin of 11.5.   PAST SURGICAL HISTORY:  He reports colonoscopy approximately 6 years  ago.  Denies any history of polyps.  He had surgery on his left foot secondary to osteomyelitis, in May of 2013.  Surgery for renal lithiasis and a cardiac catheterization with stent placement. GERD.  MEDICATIONS:  Prior to admission: 1.  Benztropine 1 mg b.i.d. 2.  Bethanechol 25 mg b.i.d. 3.  Gabapentin 100 mg t.i.d.  4.  Glimepiride 4 mg daily at lunch. 5.  Levemir 100 units/mL 25 units b.i.d.  6.  Mirtazapine 45 mg daily at bedtime. 7.  NovoLog 100 units/mL 10 units sub-Q with supper.  ALLERGIES:  COLCHICINE (diarrhea and vomiting).  FAMILY HISTORY:  There is no family history of colon carcinoma.  He does have 1 brother with history of pancreatic carcinoma, diagnosed at age 63.  His maternal grandmother also had pancreatic carcinoma in her 2470s.  Both mother and father have history of carcinoma of unknown etiology and coronary artery disease with MIs.  SOCIAL HISTORY:  [name] is divorced.  He has 2 grown healthy children.  He lives with his daughter. He is disabled.  He denies any tobacco or alcohol or drug use.  He was previously a Optician, dispensingminister.    REVIEW OF SYSTEMS:  See HPI.  GENITOURINARY:  He does have hesitancy, increased urinary frequency, incontinence which requires Depends. Otherwise negative review of systems.  PHYSICAL EXAMINATION:   VITAL SIGNS:  Temperature 98.0, pulse 94, respirations 18, blood pressure 113/60. GENERAL:  He is a well-developed, well-nourished Caucasian male in no acute distress. HEENT:  Sclerae anicteric.  Conjunctivae pink. Oropharynx pink and moist without any lesions.  NECK:  Supple without any masses or thyromegaly.  HEART:  Regular rate and rhythm.  Normal S1 and S2 without any murmurs, rubs or gallops. LUNGS:  Clear to auscultation bilaterally.  ABDOMEN:  Positive bowel sounds x 4.  No bruits auscultated. Is soft, nontender and nondistended without palpable mass or hepatosplenomegaly. No rebound tenderness or guarding.  EXTREMITIES:  Without  edema.  LABORATORY DATA:  CMP is normal with the following exception:  Calcium 8.1, sodium 135, creatinine 1.81, BUN 19 and glucose 139.  Hemoglobin 11.5 and hematocrit 32, otherwise normal CBC. Other labs reviewed.  IMPRESSION:  Mr. [name] is a 64 year old male with chronic esophageal dysphagia.  He feels as though his food gets stuck in his upper esophagus to mid esophagus.  He reports distant history of esophageal dilation.  Barium swallow showed large amount of secretions and distended esophagus and CT suggestive of esophageal thickening, possible esophagitis, cannot rule out malignancy. He is going to need EGD with Dr. Servando Snare for further evaluation and possible esophageal dilation and possible esophageal biopsies.  I have discussed the risks and benefits which include but not limited to bleeding, infection, perforation and drug reaction.  He agrees to this plan and consent will be obtained.  I have halved his Levemir the night prior to the procedure.  The procedure will be done tomorrow since he has had breakfast this morning.    PLAN:  1.  Esophagogastroduodenoscopy in the a.m. with Dr. Servando Snare, around 7:30.   2.  N.p.o. after midnight. 3.  Begin Protonix 40 mg daily. 4.  Further recommendations pending EGD.  We would like to thank Dr. Cherlynn Kaiser for allowing [place] to participate in the care of [name].  These services are provided by Joselyn Arrow, NP under collaborative agreement with Dr. Midge Minium, MD. ___________________________ Joselyn Arrow, NP klj:sb D: 01/16/2013 10:30:09 ET T: 01/16/2013 10:45:14 ET JOB#: [id number]  cc: Joselyn Arrow, NP, <Dictator> Lyndon Code, MD Joselyn Arrow FNP ELECTRONICALLY SIGNED 01/19/2013 10:12

## 2015-01-10 NOTE — H&P (Signed)
PATIENT NAME:  [name], [name] MR#:  [id number] DATE OF BIRTH:  10-05-1950  DATE OF ADMISSION:  04/05/2013  REFERRING PHYSICIAN: Glennie Isle, MD  PRIMARY CARE PHYSICIAN: Beverely Risen, MD   REASON FOR ADMISSION: Severe hypoglycemia with recurrence.   HISTORY OF PRESENT ILLNESS: Mr. [name] is a very nice 64 year old gentleman with history of diabetes, severe neuropathy, urinary retention due to neurogenic bladder, dementia and previous history of kidney stones. The patient has been recently admitted on 03/21/2013 and discharged on 03/28/2013 with a history of urinary tract infection due to Proteus with septic syndrome. The patient was discharged with a Foley catheter, which was removed today.  Then the patient went back to [facility] where he is staying for rehab, and right there he was given his insulin at regular doses. He did not eat lunch, and then he was found out to be with altered mental status with a blood sugar of 49. The patient was brought to the Emergency Department.  In the Emergency Department, he got D50, glucagon and food, and his blood sugar dropped back again in the 50s. The patient is admitted for following and observation.  During the exam and review of the data, it was noticed the patient had white blood cells of 22,000.  Prior to that, his white blood cells were 8000 with treatment of the urinary tract infection. When the catheter was removed, there was still residual urine, but the patient went to the nursing home and has been urinating. I had a long conversation with the family about his general care.  They are concerned about his blood sugar continuing to drop. The patient was seen by a Dr. Tedd Sias in an endocrinology consultation, and she recommended decreasing the glimepiride 2 mg once daily and continue the rest of the plan.    The discharge orders said that the patient needed to stop his Levemir, but apparently that medication was given to him today.   REVIEW OF  SYSTEMS: A 12-system review of systems was done.  CONSTITUTIONAL: The patient denies any fever, fatigue, weakness. He has been having difficulty walking and multiple falls, recently diagnosed with dementia.  EYES: No blurry vision, double vision, inflammation.  ENT: No difficulty swallowing. No tinnitus or epistaxis.  RESPIRATION: No cough, wheezes, COPD.  He was taking cough medications last week, but he denies any coughing.  CARDIOVASCULAR: No chest pain, orthopnea, edema or syncope.  GASTROINTESTINAL: No nausea, vomiting, abdominal pain, constipation or diarrhea.  GENITOURINARY: Positive urinary retention. Denies any significant hematuria. He does have some pain on his penis due to the catheter. No prostatitis.  ENDOCRINE: No polyuria, polydipsia, polyphagia, cold or heat intolerance.  HEMATOLOGIC/LYMPHATIC: No anemia, easy bruising or bleeding.  SKIN: No rashes or petechiae.  MUSCULOSKELETAL: No significant back pain, neck pain or gout.  The patient is ALLERGIC TO COLCHICINE, but he apparently does not have a history of gout.  NEUROLOGIC: No numbness, tingling. Recently diagnosed with dementia, short-term memory loss and confusion is positive.  PSYCHIATRIC: No insomnia, depression or agitation. The patient has history of bipolar disorder.   PAST MEDICAL HISTORY:  1.  Insulin-dependent diabetes/brittle diabetes.  2.  Multiple episodes of hypoglycemia.  3.  Diabetic neuropathy.  4.  Urinary retention.  5.  Neurogenic bladder.  6.  History of kidney stones.  7.  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  8.  Restless leg syndrome.  9.  Bipolar disorder with depression.  10.  Thickening of the bladder wall and  evaluation.  11.  History of urinary tract infection.  12.  Acute cystitis.   ALLERGIES: THE PATIENT IS ALLERGIC TO COLCHICINE.   SOCIAL HISTORY: The patient does not smoke, does not drink.  He used to live at home with his daughter, but now he is at [facility] after the last  admission.  The patient was recently admitted here with urinary tract infection complicated with sepsis.   FAMILY HISTORY: Apparently mom and dad had cancer, unknown what type. They also had coronary artery disease. His brother has pancreatic cancer.   PAST SURGICAL HISTORY: The patient denies any surgeries.   CURRENT MEDICATIONS: At discharge, the patient was supposed to be taking Benztropine 1 mg twice daily, gabapentin 100 mg 3 times a day, Wellbutrin 300 mg every 24 hours, Flomax 0.4 mg daily, Tylenol 650 every 4 hours, Glucerna shake 1 bottle 2 times a day, Robitussin-DM 10 mL for every 4 hours p.r.n., Tussionex 5 mg every 12 hours as needed, bethanechol 25 mg 3 times a day, Colace 100 mg 3 times a day, lidocaine p.r.n. penile pain, Keflex 500 mg every 8 hours, Glimepiride 2 mg a day with breakfast, Insulin sliding-scale.   PHYSICAL EXAMINATION: VITAL SIGNS: Blood pressure 120/77, pulse 92, respirations 16, temperature 97.6, oxygen saturation 94% on room air.  GENERAL: The patient is alert, oriented x 2 but pleasantly confused. No acute distress. No respiratory distress. His pupils are equal and reactive. Extraocular movements are intact. Mucosa are moist. Anicteric sclerae. Pink conjunctivae. No oral lesions. No thrush. There is a laceration at the level of the left eyelid which happened a week ago after a fall. It is clean.  No signs of infection, no bleeding.  NECK: Supple. No JVD. No thyromegaly. No adenopathy. No carotid bruits. No rigidity.  CARDIOVASCULAR: Regular rate and rhythm. No murmurs, rubs or gallops.  No tenderness to palpation of anterior chest wall. No displacement of PMI.  LUNGS: Clear without any wheezing or crepitus. No use of accessory muscles.  ABDOMEN: Soft, nontender, nondistended. No hepatosplenomegaly. No masses. Bowel sounds are positive.  GENITAL: No external lesions. Positive for Foley catheter.  SKIN: No rashes or petechiae.  LYMPHATIC: Negative for  lymphadenopathy in the neck or supraclavicular area.  MUSCULOSKELETAL: No significant joint effusions or erythema of the joints.   VASCULAR: Capillary refill less than 3. Pulses +2.  NEUROLOGIC: Cranial nerves II through XII intact. Strength is 5 out of 5 in all 4 extremities. No focal findings.  PSYCHIATRIC: Mood is normal without any signs of agitation. The patient is alert and oriented x 2 and pleasantly confused occasionally or intermittently.   LABORATORY AND RADIOLOGICAL DATA:  Glucose was 34 here at the ER, now is 246, BUN 27, creatinine 1.86, sodium is 134, GFR is around 38, albumin 3.2, AST 10.  White blood cells almost 23,000, hemoglobin 12, platelets 419.  Urinalysis has 7 white blood cells, trace leukocyte esterase, 3 red blood cells.  Chest x-ray: No acute infiltrates.     ASSESSMENT AND PLAN: 1.  Hypoglycemia:  The patient has brittle diabetes, and he has multiple episodes of hypoglycemia in the past. He was discharged over here with indications to decrease his glimepiride to 2 mg and stop Levemir. I reviewed and medication orders brought by the facility. The patient has been receiving his Levemir despite the fact that he was supposed to stop it. At this moment, we are going to keep him as an observation. Monitor his blood sugars every 4 hours, hypoglycemia  protocol and process. Hold insulin Levemir as mentioned above. Whenever he is discharged, we have to make sure that the patient is not taking Levemir at the facility, and monitor his blood sugars very often.  Only treat with insulin sliding scale.  2.  As far as his urinary obstruction:  The catheter was placed back in by the ER physician because of urinary retention. Follow up with Urology outpatient.  The patient likely is going to need a permanent Foley catheter. We are going to increase his Flomax to twice daily in case we are  to remove the catheter. Continue bethanechol for now. This urinary retention is secondary to neurogenic  bladder.  3.  Elevation of white blood cells:  This could be secondary to urinary tract infection. The patient just had a Foley catheter for several days. He was discharged on Keflex for a Proteus urinary tract infection. That bacteria was sensitive to cefazolin and ceftriaxone for which Keflex likely was appropriate, but this patient has a Foley catheter indwelling chronically.  He might catch another infection, for which we are going to treat him empirically for now, send urine for culture. No other signs or indication that there is other focus of infection.  4.  Neuropathy:  Continue gabapentin.  5.  Bipolar disorder: Seems to be stable. 6.  Dementia:  Continue to observe.  7.  Thickening of the bladder: This could be secondary to infection versus cancerous process. The patient needed a cystoscopy outpatient.  8.  Deep vein thrombosis prophylaxis with PLCs and heparin.  GI prophylaxis with PPI.   TIME SPENT:   I spent about 50 minutes with this patient discussing the case with the family.   CODE STATUS:  The patient is a FULL CODE.     ____________________________ Felipa Furnace, MD rsg:cb D: 04/05/2013 20:09:37 ET T: 04/05/2013 20:36:30 ET JOB#: [id number]  cc: Felipa Furnace, MD, <Dictator> Elier Zellars Juanda Chance MD ELECTRONICALLY SIGNED 04/13/2013 20:45

## 2015-01-10 NOTE — Discharge Summary (Signed)
PATIENT NAME:  [name], [name] MR#:  [id number] DATE OF BIRTH:  1951-08-04  DATE OF ADMISSION:  04/05/2013 DATE OF DISCHARGE:    ADMITTING PHYSICIAN: Felipa Furnace, MD  DISCHARGING PHYSICIAN: Enid Baas, MD  PRIMARY CARE PHYSICIAN: Lyndon Code, MD  CONSULTATIONS IN THE HOSPITAL: None.   DISCHARGE DIAGNOSES: 1.  Metabolic encephalopathy from hypoglycemia, which is resolved. 2.  Brittle diabetes with hypoglycemia episodes.  3.  Bipolar with depression.  4.  Recent Proteus urinary tract infection, which has been treated.  5.  Leukocytosis on admission, could be stress reaction.  6.  Chronic kidney disease stage III with baseline creatinine of 1.6 to 1.8.  7.  Chronic bladder wall thickening from urinary retention.  8.  Chronic intermittent urinary retention and self-catheterization recommended as needed.  9.  Early dementia.  10.  Restless legs.  DISCHARGE MEDICATIONS:  1.  Benztropine 1 mg p.o. b.i.d. for anti-Parkinson's.  2.  Gabapentin 100 mg p.o. 3 times a day for neuropathy.  3.  Bethanechol 25 mg p.o. 3 times a day.  4.  Ultram 50 mg p.o. q.4 hours p.r.n. for pain.  5.  Wellbutrin XL 300 mg p.o. daily.  6.  Robitussin-DM 5 mL q.4 hours p.r.n. for congestion.  7.  Colace 100 mg p.o. b.i.d.  8.  Flomax 0.4 mg p.o. b.i.d.  9.  Aspart insulin sliding scale as recommended 4 times a day before meals and at bedtime. Advised to give 2 units for fingersticks 201 to 250, 4 units for 251 to 300, 6 units for 301 to 350, 8 units for 351 to 400, and greater than 400 please call MD.  10.  Levaquin 500 mg p.o. daily for 3 more days.  DISCHARGE DIET: Regular diet.   DISCHARGE ACTIVITY: As tolerated.    FOLLOWUP INSTRUCTIONS: 1.  Follow up with Dr. Achilles Dunk, urology, in 4 to 6 weeks as prior scheduled.  2.  Follow up with Dr. Tedd Sias in 2 weeks as prior scheduled.  3.  PCP followup in 1 to 2 weeks.  4.  WBC recheck in 3 to 4 days.  5.  Fingersticks q.6 hours and sliding  scale coverage only.  6.  Discontinue Foley catheter and monitor for any urinary retention. Please do and in-and-out catheterization if the bladder retaining volume is greater than 450 mL.   LABORATORY AND IMAGING STUDIES PRIOR TO DISCHARGE:  1.  WBC 11.8, hemoglobin 11.6, hematocrit 34.0, platelet count 371.  2.  Sodium 135, potassium 4.4, chloride 99, bicarbonate 29, BUN 26, creatinine 1.68, glucose 244 and calcium of 8.7.  3.  ALT 10, AST 9, alkaline phosphatase 139,  total bilirubin 0.3 and albumin of 3.8 and magnesium 2.0.  4.  Blood cultures were negative initially.  5.  Urine culture is negative so far.  6.  Chest x-ray showing evidence of acute bronchitis, but no evidence of pneumonia or CHF noted.  7.  Urinalysis negative for any infection.   BRIEF HOSPITAL COURSE: [name] is a 64 year old Caucasian male with past medical history significant for bipolar with depression, dystonias, diabetic neuropathy, hypertension,  history of brittle diabetes with hypoglycemic episodes in the past, chronic neurogenic bladder and chronic urinary retention and home self-catheterization was advised in the past, CKD stage III, presented to the hospital from [facility] secondary to hypoglycemic episode.  1.  Acute metabolic encephalopathy secondary to hypoglycemia: The patient was here last week for a Proteus urinary tract infection and during the hospitalization, he has  had multiple hypoglycemic episodes and was seen by endocrinology prior to discharge. His Levemir was stopped. His glimepiride was cut down to 2 mg daily and he was discharged only on that 2 mg of glimepiride and sliding scale insulin. However, it seems as per admission note that the patient did get Levemir yesterday prior to admission and was found to be confused and fingersticks were in the 40s, so the patient was started on D5 in the hospital. Sugars were greater than 200, his mental status back to baseline, he was eating okay and D5 was  stopped, in spite of which he is maintaining his sugars, so he is being discharged only on sliding scale insulin. Do not give any Levemir or glimepiride. The patient already has an appointment set up with Dr. Tedd Sias in 2 weeks, which he will need to keep up with at this time. His last HbA1c was 10.8.  2.  Leukocytosis which was concerning on this admission: Urinalysis is negative for any infection. Blood cultures and urine cultures are negative so far. He was started on Rocephin on admission. However, white count improved from 22,000 to 11,000 at this time. The chest x-ray has evidence of minimal bronchitis, so will discharge him on Levaquin 500 mg p.o. daily for 3 more days. The leukocytosis could also be a stress reaction.  3.  Chronic bladder wall thickening and chronic urinary retention: The patient's daughter was really concerned about the bladder wall thickening where the radiologist has commented possible malignancy. However, I had a long discussion with Dr. Achilles Dunk, who had seen the patient in the hospital last admission last week and also had seen as a followup earlier this week in the office, and does not feel that the patient has any malignancy. The patient has had chronic bladder wall thickening from chronic urinary retention for several years and the patient has been extensively worked up in [employer] for the same in the past. The patient was discharged on Foley catheter last week and patient has pulled out the Foley catheter earlier this week with inflated bulb in. The patient would not be a candidate for chronic indwelling Foley, as he will put it out, so Dr. Achilles Dunk has recommended now and also in the past was recommended to the patient that he needs to do self-catheterization if the bladder volume is worsening. This is nothing new to the patient and has had done in the past, but due to worsening of his dementia and bipolar problems, he is not able to do that. Since he is going to the rehab, instructions  will be put in for him to have chronic intermittent self-catheterizations or in-and-outs as needed. As mentioned above, Dr. Achilles Dunk has just seen the patient in the office this week and has a followup appointment in 4 to 6 weeks.  4.  Hypertension: Medication was continued at the time of discharge. 5.  Bipolar with restless legs, dystonias: Follow up with psychiatry as an outpatient from [place].  6.  Peripheral neuropathy: Continue the gabapentin. 7.  His course has been otherwise uneventful in the hospital. Discharge plans were explained to the daughter and all her questions were answered to her satisfaction. The patient is discharged back to [facility].   DISCHARGE CONDITION: Stable.   DISCHARGE DISPOSITION: [employer].  TIME SPENT ON DISCHARGE: 45 minutes.    ____________________________ Enid Baas, MD rk:jm D: 04/06/2013 14:12:10 ET T: 04/06/2013 14:38:02 ET JOB#: [id number]  cc: Enid Baas, MD, <Dictator> A. Wendall Mola, MD  Madolyn FriezeBrian S. Achilles Dunkope, MD Lyndon CodeFozia M. Khan, MD Enid BaasADHIKA Keelee Yankey MD ELECTRONICALLY SIGNED 04/23/2013 15:12

## 2015-01-10 NOTE — Consult Note (Signed)
Brief Consult Note: Diagnosis: Urinary retention. BPH. Mild azotemia.   Patient was seen by consultant.   Consult note dictated.   Recommend further assessment or treatment.   Orders entered.   Discussed with Attending MD.   Comments: Continue foley for at least one week due to bladder distention. Continue tamsulosin. Stop bethanochol.  May be discharged with catheter when stable from medical standpoint. Followup in the office in one week.  Electronic Signatures: Orson ApeWolff, Michael R (MD)  (Signed 28-Apr-14 13:02)  Authored: Brief Consult Note   Last Updated: 28-Apr-14 13:02 by Orson ApeWolff, Michael R (MD)

## 2015-01-10 NOTE — H&P (Signed)
PATIENT NAME:  [name], [name] MR#:  [id number] DATE OF BIRTH:  18-Mar-1951  DATE OF ADMISSION:  03/21/2013  PRIMARY CARE PHYSICIAN:  Dr. Beverely RisenFozia Khan.   REQUESTING PHYSICIAN:  Dr. Jolene ProvostKirtida Patel.   CHIEF COMPLAINT:  Lower abdominal pain, urinary retention and uncontrolled blood sugar with some mental status changes.   HISTORY OF PRESENT ILLNESS:  The patient is a 64 year old male with a known history of diabetes, diabetic neuropathy and urinary retention with neurogenic bladder, is being admitted for worsening urinary retention with difficulty voiding for the last two days, has been getting worse.  Sugars have been staying about 350s to 400s and per family some mental status changes also for which they took him [facility] where he was found to have UTI and Dr. Allena KatzPatel requested direct admit as the patient looked sick based on his presentation with UTI and has a history of multiple UTIs in the past.  When I evaluated the patient, felt that he has been having a lot of pain in hands and feet from his neuropathy and has been gradually getting worse over the last three weeks.  He was able to urinate around 3:00 while he was at urgent care, but has been keep feeling weak.   PAST MEDICAL HISTORY: 1.  History of diabetes.  2.  Diabetic neuropathy.  3.  History of urinary retention with neurogenic bladder.  4. H/o Kidney stones  ALLERGIES:  COLCHICINE.   SOCIAL HISTORY:  No smoking.  No alcohol.  No history of illicit drug use.  He lives at home with his daughter.   FAMILY HISTORY:  Mother and father died from complications of malignancy.  They also had a heart attack.  He has a brother with pancreatic cancer.   MEDICATIONS AT HOME: 1.  Benztropine 1 mg by mouth twice daily.  2.  Bethanechol 25 mg by mouth twice daily.  3.  Gabapentin 100 mg by mouth 3 times a day.  4.  Glimepiride 4 mg by mouth daily.  5.  Insulin Levemir 25 units subQ twice daily.  6.  Insulin NovoLog 10 units subQ in the  evening at supper.   REVIEW OF SYSTEMS:  CONSTITUTIONAL:  No fever.  Positive for fatigue and weakness.  EYES:  No blurred or double vision.  EARS, NOSE, THROAT:  No tinnitus or ear pain.  RESPIRATORY:  No cough, wheezing, hemoptysis.  CARDIOVASCULAR:  No chest pain, orthopnea, edema.  GASTROINTESTINAL:  No nausea, vomiting, diarrhea.  GENITOURINARY:  Positive for dysuria.  No hematuria.  Positive for urinary retention and history of nephrolithiasis.  ENDOCRINE:  No polyuria or nocturia.  HEMATOLOGY:  No anemia or easy bruising.  SKIN:  No rash or lesion.  MUSCULOSKELETAL:  Positive for difficulty in ambulation.  NEUROLOGICAL:  Positive for tingling and numbness in both hands and feet from diabetic neuropathy.  PSYCHIATRIC:  No history of anxiety or depression.   PHYSICAL EXAMINATION: VITAL SIGNS:  Temperature 99, heart rate 89 per minute, respirations 18 per minute, blood pressure 159/91 mmHg.  He was saturating 92% on room air.  GENERAL:  The patient is a 64 year old male lying in the bed comfortably without any acute distress.  EYES:  Pupils equal, round, reactive to light and accommodation.  No scleral icterus.  Extraocular muscles intact. HENT:  Head atraumatic, normocephalic.  Oropharynx and nasopharynx clear.  NECK:  Supple.  No jugular venous distention.  No thyroid enlargement or tenderness.  LUNGS:  Clear to auscultation bilaterally.  No wheezing, rales, rhonchi or crepitation.  CARDIOVASCULAR:  S1, S2 normal.  No murmurs, rubs or gallops.  ABDOMEN:  Soft, nontender, nondistended.  Bowel sounds present.  No hepatosplenomegaly or mass.  EXTREMITIES:  No pedal edema, cyanosis, clubbing.  NEUROLOGIC:  Nonfocal examination.  Cranial nerves II through XII intact.  Muscle strength 5 out of 5 in all extremities.  Sensation intact, except some decreased sensation in the distal extremities bilaterally.  SKIN:  No obvious rash, lesion or ulcer.  PSYCHIATRIC:  The patient is alert and  oriented x 3.   LABORATORY AND RADIOLOGICAL DATA:  Normal BMP except creatinine of 2.01.  Blood sugar of 240.  Liver function tests showed alkaline phosphatase of 174.  CBC showed white count 14.1, hemoglobin 12.7, hematocrit 37.7, platelets 231.  UA was cloudy with 3+ blood, 2+ leukocyte esterase, positive nitrite, too numerous WBCs, 4+ bacteria and WBC in clumps present.   IMPRESSION AND PLAN:   1.  Lower abdominal pain with intermittent urinary retention.  We will go ahead and get  kidney ultrasound and get a bladder scan to make sure.  If he is retaining, we may need to put a Foley catheter in and consider urology consult.  He is already on bethanechol which we will continue at this time.  We can also consider starting him on Flomax if needed. He could also have some bladder patho with recurrent kidney stones. 2.  Urinary tract infection based on urinalysis.  Could be secondary to urinary retention.  We will start him on IV Rocephin and get urine culture sensitivity.  3.  Uncontrolled diabetes.  Continue his home insulin regimen and start him on sliding scale insulin and adjust dose as needed.  4.  Diabetic neuropathy.  We will increase the dose of gabapentin to control his neuropathy.  5.  Acute on chronic kidney disease, stage 2 to 3 with a baseline creatinine of 1.6 to 1.8.  This is likely prerenal etiology, although cannot rule out postobstructive nature.  We will get a bladder scan seizure to see whether we need to insert Foley.  6.  Mental status changes, likely metabolic encephalopathy from underlying urinary tract infection and uncontrolled sugar.  We will monitor.  At this time he seems clear.  7.  Generalized weakness.  We will get physical therapy evaluation management.  8.  CODE STATUS:  FULL CODE.   Total time taking care of this patient is 55 minutes.    ____________________________ Ellamae Sia. Sherryll Burger, MD vss:ea D: 03/21/2013 18:40:33 ET T: 03/21/2013 19:16:28  ET JOB#: [id number]  cc: Lora Chavers S. Sherryll Burger, MD, <Dictator> Lyndon Code, MD Jolene Provost, MD Ellamae Sia [facility] MD ELECTRONICALLY SIGNED 03/22/2013 16:07

## 2015-01-10 NOTE — Discharge Summary (Signed)
PATIENT NAME:  [name], [name] MR#:  [id number] DATE OF BIRTH:  02/09/1951  DATE OF ADMISSION:  03/21/2013 DATE OF DISCHARGE:  03/28/2013  ADMITTING PHYSICIAN:  Dr. Delfino Lovett   DISCHARGING PHYSICIAN:  Dr. Enid Baas  PRIMARY CARE PHYSICIAN:  Dr. Beverely Risen  CONSULTATIONS IN THE HOSPITAL:  1.  Urology consultation by Dr. Achilles Dunk.  2.  Endocrinology consultation by Dr. Tedd Sias.  DISCHARGE DIAGNOSES: 1.  Acute metabolic encephalopathy.  2.  Acute cystitis and prostatitis.  3.  Urinary retention with indwelling Foley catheter now.  4.  Thickening of bladder wall requiring cystoscopic evaluation as an outpatient.  5.  Early dementia.  6.  Bipolar with depression.  7.  Restless legs.  8.  Diabetes mellitus with hypoglycemic episodes while in the hospital.  9.  Neurogenic bladder.  10.  Nephrolithiasis. 11.  CKD Stage III.  DISCHARGE HOME MEDICATIONS: 1.  Benztropine 1 mg p.o. b.i.d.  2.  Gabapentin 100 mg p.o. 3 times a day.  3.  Wellbutrin 300 mg 1 tablet every 24 hours.  4.  Flomax 0.4 mg p.o. daily.  5.  Tylenol 650 mg q. 4 hours p.r.n. 6.  Glucerna shake, vanilla flavor, 1 bottle 3 times a day with meals.  7.  Robitussin-DM 10 mL q. 4 hours p.r.n. for congestion and cough.  8.  Tussionex 5 mL every 12 hours as needed for cough.  9.  Bethanechol 25 mg p.o. 3 times a day.  10.  Colace 100 mg p.o. 3 times a day.  11.  Lidocaine 5% ointment applied to the penile area twice a day for pain.  12.  Keflex 500 mg p.o. q. 8 hours until 03/31/2013.  13.  Glimepiride 2 mg p.o. daily with breakfast.  14.  Sliding scale insulin with NovoLog subcutaneously twice a day before meals q. 2 units for blood sugars 201 to 250, 4 units for blood sugars 251 to 300, 6 units for blood sugars 301 to 350, 8 units for blood sugars 251 to 400 and 10 units for blood sugars greater than 400 and then call MD.  DISCHARGE DIET:  Low-sodium, ADA 1800 diet.   DISCHARGE ACTIVITY:  As tolerated.  FOLLOWUP  INSTRUCTIONS: 1.  Indwelling Foley catheter until seen by urology as an outpatient.  2.  Fingersticks q. a.c. and at bedtime.  3.  Physical therapy.  4.  PCP follow-up in 2 weeks.  5.  Urology followup in 1 week.  6.  Follow up with psyche in 2 weeks.  LABORATORIES AND IMAGING STUDIES PRIOR TO DISCHARGE:  WBC is 8.8, hemoglobin 11.3, hematocrit 32.4, platelet count is 255.  Sodium 140, potassium 4.0, chloride 104, bicarb 32, BUN 17, creatinine 1.4, glucose of 80, calcium of 8.7. HbA1c of 10.1.  Chest x-ray showing mild interstitial hazy opacities of left lung could be symmetric pulmonary edema or infection.  CT of the abdomen and pelvis done for stone on admission showing nonobstructing stones in left kidney.  Considerable amount of stool throughout the colon and bladder wall thickening, which is improved from previous study in April 2014.  No hepatobiliary abnormality is present.  Blood cultures remain negative.  Ultrasound of kidneys bilaterally showing no hydronephrosis, bilateral nephrolithiasis and thickening of the bladder wall is present.  Urine cultures growing Klebsiella pneumonia which are pansensitive.  MRI of the brain without contrast showing white matter changes consistent with chronic ischemia. No hydrocephalus, no masses or acute changes seen.   BRIEF HOSPITAL COURSE:  [name]  is a 64 year old Caucasian male with past medical history significant for diabetes, hypertension, diabetic neuropathy, history of urinary retention with neurogenic bladder in the past and CKD, presented to the hospital secondary to elevated blood sugars, lower abdominal pain and urinary retention associated with mental status changes. 1.  Acute metabolic encephalopathy, likely secondary to his underlying infection.  He had cystitis and prostatitis with urine cultures growing Klebsiella bacteria.  He was on Rocephin while in the hospital and based on the sensitivities changed over to Keflex and will be  treated until 12th of July.  His mental status has improved and almost back to baseline at this time. MRI was also done which revealed no acute changes.  No infarct or masses seen.  No CVA identified.  The patient probably has early dementia changes, also clinically based on talking to the daughter at this time.  The patient can follow up with neurology as an outpatient. 2.  He also had bladder wall thickening seen on ultrasound of the kidneys and was seen by Dr. Achilles Dunkope for his urinary retention and had an indwelling Foley catheter placed. Per urology recommendations, catheter is recommended for 10 days to allow decompression of the bladder and recheck to see decreased thickening of the bladder wall. Treat with antibiotics and then follow up as an outpatient for voiding trial before the Foley catheter is taken out and also probably might need a cystoscopy as an outpatient. Appointment will be set up prior to discharge. 3.  Diabetes mellitus with hyperglycemic episodes in the hospital.  HbA1c is 10.5 in the hospital.  Sugars at home has been elevated but since admission he has been extremely hyperglycemic. He was initially on Levemir, NovoLog and also glimepiride. Endocrinology has been consulted and the patient has been off of almost all his medications while in the hospital, but he is being discharged on 2 mg of Amaryl and also sliding scale insulin and will follow up with endocrinology as an outpatient. He will continue to be on 1800 calorie diet.  4.  Hypertension.  His home medications are being continued.  5.  History of bipolar with depression and restless legs.  [facility] and has been on different medications, but currently on Wellbutrin, benztropine.  The patient does have restless legs with involuntary movements of his legs and arms at the times, which has been chronic and he can follow up with psychiatry as an  outpatient. 6.  Peripheral neuropathy.  He has been on gabapentin 100 mg p.o. t.i.d.  The dose was tried to be increased while in the hospital but that caused more lethargy so that has been changed back to his home dose. 6.  His course has been otherwise uneventful in the hospital.  The patient worked with physical therapy who recommended skilled nursing facility at this time.   DISCHARGE CONDITION:  Stable.   DISCHARGE DISPOSITION:  To rehab facility.  TIME SPENT ON DISCHARGE:  45 minutes   ____________________________ Enid Baasadhika Jillianna Stanek, MD rk:ce D: 03/28/2013 13:31:45 ET T: 03/28/2013 13:57:58 ET JOB#: [id number]  cc: Enid Baasadhika Razia Screws, MD, <Dictator> A. Wendall MolaMelissa Solum, MD Madolyn FriezeBrian S. Achilles Dunkope, MD  Enid BaasADHIKA Waverly Tarquinio MD ELECTRONICALLY SIGNED 04/16/2013 16:50

## 2015-01-10 NOTE — H&P (Signed)
PATIENT NAME:  [name], [name] MR#:  [id number] DATE OF BIRTH:  1950-11-26  DATE OF ADMISSION:  01/14/2013  PRIMARY CARE PHYSICIAN: Dr. Beverely RisenFozia Khan.   CHIEF COMPLAINT: Low abdominal pain and urinary retention.   HISTORY OF PRESENT ILLNESS: This is a 64 year old male who presents to the hospital due to lower abdominal pain and urinary retention. The patient says these symptoms have been ongoing now for about a week or so. The patient says when he tries to urinate, only little bit comes out then he gets the urge to urinate again and he has not been able to urinate properly over the past week or so. He apparently was seen at the urgent care the early part of last month and treated for, urinary tract infection with some urinary retention. The patient does not have a regular urologist and does not have any history of prostate problems. He presented to the hospital with some lower abdominal pain, urinary retention. He had a bladder scan done in the Foley placed with about 700 mL of urine drained. Due to his urinary retention and his urinalysis consistent with a urinary tract infection, hospitalist services were contacted for further treatment and evaluation. The patient does admit to a fever, but it has not been documented. He complains of some generalized weakness, some nausea, one episode of vomiting in the past week, which was nonbloody and bilious in nature. No other associated symptoms presently.   REVIEW OF SYSTEMS:   CONSTITUTIONAL: No documented fever. No weight gain or weight loss.  EYES: No blurry or double vision.  ENT: No tinnitus. No postnasal drip. No redness of the oropharynx.  RESPIRATORY: No cough, no wheeze, no hemoptysis, no dyspnea.  CARDIOVASCULAR: No chest pain, no orthopnea, no palpitations, no syncope.  GASTROINTESTINAL: Positive nausea, positive vomiting, no diarrhea, positive lower abdominal pain, no melena or hematochezia.  GENITOURINARY: Positive dysuria, no hematuria. Positive  frequency and retention.  ENDOCRINE: No polyuria or nocturia. No heat or cold intolerance.  HEMATOLOGIC:  No anemia, no bruising, no bleeding.  INTEGUMENTARY: No rashes. No lesions.  MUSCULOSKELETAL: No arthritis, no swelling. No gout.  NEUROLOGIC: No numbness or tingling. No ataxia. No seizure-type activity.  PSYCHIATRIC: No anxiety, no insomnia. No ADD.   PAST MEDICAL HISTORY: Consistent with history of diabetes, diabetic neuropathy, history of urinary retention with neurogenic bladder, history of MSSA septicemia secondary to left foot/ toe osteomyelitis.   ALLERGIES: COLCHICINE.   SOCIAL HISTORY: No smoking. No alcohol abuse. No illicit drug abuse. Lives at home with his daughter.   FAMILY HISTORY: Both mother and father died from complications of malignancy, but he does not recall what kind of cancer. Both mother and father also had heart attacks. He has a brother who has pancreatic cancer.   CURRENT MEDICATIONS: Benztropine 1 mg b.i.d., bethanechol 25 mg b.i.d., gabapentin 100 mg t.i.d., glimepiride 4 mg daily, Levemir 25 units b.i.d., Remeron 45 mg at bedtime and NovoLog 10 units at evening with supper.   PHYSICAL EXAMINATION: Presently is as follows:   VITAL SIGNS: Temperature 98.5, pulse 90, respirations 16, blood pressure 130/78, sats 99% on room air.  GENERAL: He is a pleasant appearing male in no apparent distress.  HEENT: He is atraumatic, normocephalic. Extraocular muscles are intact. Pupils equal, round and reactive to light. Sclerae is anicteric. No conjunctival injection. No pharyngeal erythema.  NECK: Supple. There is no jugular venous distention, no bruits, no lymphadenopathy or thyromegaly.  HEART: Regular rate rhythm. No murmurs or rubs. No clicks.  LUNGS: Clear to auscultation bilaterally. No rales, no rhonchi. No wheezes.  ABDOMEN: Soft, flat, nontender, nondistended. Has good bowel sounds. No hepatosplenomegaly appreciated.  EXTREMITIES: No evidence of any cyanosis,  clubbing or peripheral edema. Has +2 pedal and radial pulses bilaterally.  NEUROLOGICAL: The patient is alert, awake and oriented x 3 with no focal motor or sensory deficits appreciated bilaterally.  SKIN: Moist and warm with no rash appreciated.  LYMPHATIC: There is no cervical or axillary lymphadenopathy.   LABORATORY, DIAGNOSTIC AND RADIOLOGIC DATA:  Serum glucose of 357, BUN 27, creatinine 2.08, sodium 131, potassium 4.4, chloride 96, bicarbonate 26. The patient's albumin is 3.1. White cell count 12, hemoglobin 12.9, hematocrit 37.4, platelet count 277. Urinalysis shows 2+ leukocyte esterase,  positive nitrites, 64 white cells.   ASSESSMENT AND PLAN: This is a 64 year old male with a history of diabetes, diabetic neuropathy, history of urinary retention with a neurogenic bladder, history of Methicillin-sensitive Staphylococcus aureus septicemia secondary to left foot/toe osteomyelitis, presents to the hospital with lower abdominal pain and urinary retention.  1.  Abdominal pain with urinary retention. The likely cause of the patient's lower abdominal pain is the urinary retention. The patient apparently had a bladder scan, which showed a greater than 300 mL of urine. Had a Foley placed and had about 700 mL of urine drained. The patient says he has had this problem in the past. He been on bethanechol, but it does not seem to be helping. He does not have a urologist he sees regularly. For now, continue with Foley. I will hold his bethanechol and start him on some, Flomax. We will get a urology consult in the morning.  2.  Urinary tract infection. This was likely secondary to the urinary retention. We will give him IV ceftriaxone, as he had Escherichia coli urinary tract infection in the past, sensitive to ceftriaxone.  3.  Diabetes. His sugars are uncontrolled. Continue his Levemir and NovoLog for now. Place him on sliding scale insulin coverage. Check a hemoglobin A1c, adjust insulin as needed.  4.   History of Methicillin-sensitive Staphylococcus aureus bacteremia with left foot/toe osteomyelitis. The patient has finished antibiotic therapy. No acute issue related to this.  5.  Diabetic neuropathy. Continue Neurontin.  6.  Chronic renal failure. The patient's baseline creatinine is around 1.8, currently up to 2.0. I will get a renal ultrasound  and also get a nephrology consult for now.   CODE STATUS: The patient is a full code.   TIME SPENT WITH THE ADMISSION: 50 minutes    ____________________________ Rolly Pancake. Cherlynn Kaiser, MD vjs:cc D: 01/14/2013 16:34:55 ET T: 01/14/2013 17:33:34 ET JOB#: [id number]  cc: Rolly Pancake. Cherlynn Kaiser, MD, <Dictator> Houston Siren MD ELECTRONICALLY SIGNED 01/16/2013 19:38

## 2015-01-10 NOTE — Discharge Summary (Signed)
PATIENT NAME:  [name], [name] MR#:  [id number] DATE OF BIRTH:  [birth year]  DATE OF ADMISSION:  05/02/2013 DATE OF DISCHARGE:  05/04/2013  HISTORY AND HOSPITAL COURSE: A 64 year old gentleman with history of dementia who was seen in the office of Dr. Tedd Sias on 05/02/2013. He was found to have a blood pressure of 84/55 for what he was sent directly to the ER. The patient in the ER was found to have a blood pressure of 118/74, but he had orthostatic hypotension with significant drop of his blood pressure standing. The patient was symptomatic. He denied any fever, chills or any other problems. He was found to have increased white blood cells in urine for what he was diagnosed with a possible urinary tract infection and treated with Rocephin. The patient was kept because of changes on blood pressure, mental status. He was a little bit more confused than usual, although he was never lethargic. The patient was seen and evaluated by me. He had significant improvement by the 15th for what we decided to send him back home. He is a resident of a skilled nursing facility, but now he is going to go to [facility]. He is being accepted down there. As far as his problems go, the patient had hypotension, which was likely due to orthostatic. After a long conversation with the family they tell me that he was not drinking or eating much over the last couple of days for what this is likely secondary to dehydration. He had acute kidney injury with improvement of his creatinine after IV fluids were given. As far as a possible UTI, the patient has chronic pyuria and a chronic indwelling Foley catheter. He was started on Rocephin. Urine culture did not show any growth or any bacteria in general for what we stopped the Rocephin and did not continue any treatment. 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 He has diabetes with diabetic  neuropathy, pre-renal diabetes with multiple episodes of hypoglycemia. He is being followed by Dr. Tedd Sias. At this moment, she has him on insulin sliding scale only.  He did not have any hypoglycemic episodes in this hospitalization. He does have history of neurogenic bladder for what he requires a Foley catheter. The patient did well during this hospitalization. Chest x-ray did not show any signs of pneumonia. His creatinine on admission was 2.08, went down to 1.29 on discharge. Electrolytes within normal limits. LFTs within normal limits. White blood count on admission was 12.4; at discharge 5.9. Likely due to dehydration, hemoconcentration, as the patient does not have any urinary infection. Blood cultures were negative. Urine culture was negative.   DISCHARGE MEDICATIONS: 1.  Flomax 0.4 mg twice daily. 2.  Dextromethorphan with guaifenesin as needed for cough. 3.  Protonix 40 mg once daily. 4.  Glucerna 237 mL 2 times daily. 5.  Tramadol 50 mg 4 times a day as needed for pain. 6.  Gabapentin 1 capsule 3 times a day. 7.  Depakote 125 mg twice daily. 8.  Wellbutrin XL 300 mg once a day. 9.  NovoLog FlexPen insulin sliding scale. 10.  Benztropine 1 mg twice daily. 11.  Ativan 0.5 mg twice daily. 12.  Colace 100 mg once daily. 13.  Bethanechol 25 mg 3 times daily.   DISCHARGE FOLLOWUP:  With Dr. Tedd Sias, Dr. Beverely Risen and Dr. Achilles Dunk urology within the next 2 weeks.  TIME SPENT: About 45 minutes with this discharge today. ____________________________ Felipa Furnaceoberto Sanchez Gutierrez, MD rsg:sb D: 05/04/2013 13:38:58 ET T: 05/04/2013 14:33:04 ET JOB#: [id number]  cc: Felipa Furnaceoberto Sanchez Gutierrez, MD, <Dictator> Doralee Kocak Juanda ChanceSANCHEZ GUTIERRE MD ELECTRONICALLY SIGNED 05/05/2013 15:30

## 2015-01-10 NOTE — Consult Note (Signed)
PATIENT NAME:  [name], [name] MR#:  [id number] DATE OF BIRTH:  1951/01/02  DATE OF CONSULTATION:  01/15/2013  REFERRING PHYSICIAN:  Hilda LiasVivek Sainani, MD CONSULTING PHYSICIAN:  Suszanne ConnersMichael R. Evelene CroonWolff, MD  REASON FOR CONSULTATION: Urinary retention.   HISTORY OF PRESENT ILLNESS: [name] is a 64 year old Caucasian male who presented to the Emergency Room with inability to void. He was found to have urinary retention and Foley catheter was placed. Approximately 700 mL of clear urine was obtained. He has a long history of BPH and difficulty voiding. [place] until several years ago. He was prescribed bethanechol for his urinary symptoms. He has not followed up with his urologist in over 2 years, however. His urinary symptoms have worsened in the last year. He has also had several urinary tract infections in the past year. Current symptoms include weak stream, straining, frequency, urgency and nocturia x 2 to 3.   ALLERGIES: COLCHICINE.   CHRONIC HOME MEDICATIONS:  Included benztropine, bethanechol, gabapentin, glimepiride, Levemir, Remeron and NovoLog insulin.  PAST SURGICAL HISTORY:  The patient has had no prior surgical procedures.   PAST AND CURRENT MEDICAL CONDITIONS:  1.  Diabetes. 2.  Diabetic neuropathy.  3.  History of osteomyelitis of the left foot.   REVIEW OF SYSTEMS: The patient denied bone pain or loss of appetite. He denied hematuria.   PHYSICAL EXAMINATION: GENERAL: A thin white male in no distress.  HEENT: Sclerae were clear. Pupils were equal, round and reactive to light and accommodation.  NECK: Supple. No palpable cervical adenopathy.  ABDOMEN: Abdomen was soft, nontender. No palpable abdominal masses. No CVA tenderness.  GENITOURINARY: Circumcised. Testes atrophic, 16 mL in size each.  RECTAL: 40 gram smooth nontender prostate.   PERTINENT LABORATORY AND DIAGNOSTICS:  Include BUN of 26 and creatinine of 1.83, on 04/28.    Renal ultrasound today revealed a parenchymal stone in the right kidney. Left kidney was normal.  White cell count was 9300 with hematocrit of 32%.  IMPRESSION: 1.  Acute urinary retention.  2.  Benign prostatic hypertrophy.  3.  Anemia.  4.  Diabetes. 5.  Diabetic neuropathy.   SUGGESTIONS: 1.  Continue Foley catheter for at least 1 week due to recent bladder distention. 2.  Continue tamsulosin daily.  3.  Stop bethanechol.  4.  The patient may be discharged home with a Foley catheter and leg bag when he is stable from a medical standpoint.  5.  Follow up in 1 week for trial of voiding. ____________________________ Suszanne ConnersMichael R. Evelene CroonWolff, MD mrw:sb D: 01/15/2013 13:39:25 ET T: 01/15/2013 14:13:33 ET JOB#: [id number]  cc: Suszanne ConnersMichael R. Evelene CroonWolff, MD, <Dictator> Orson ApeMICHAEL R Anysha Frappier MD ELECTRONICALLY SIGNED 01/15/2013 15:11

## 2015-01-10 NOTE — Consult Note (Signed)
PATIENT NAME:  [name], [name] MR#:  [id number] DATE OF BIRTH:  October 29, 1950  DATE OF CONSULTATION:  03/22/2013  CONSULTING PHYSICIAN:  Madolyn FriezeBrian S. Achilles Dunkope, MD  HISTORY:  [name] is a 64 year old gentleman who presented with lower abdominal pain, urinary retention, and uncontrolled blood sugars, with mental status changes. He has a history of diabetes mellitus with severe diabetic neuropathy and urinary retention. He has a history of reported neurogenic bladder. He [place]. He was supposed to be on self-intermittent catheterization. He states he was unable to perform. He states he had been voiding relatively well until several days prior to admission. He was having more difficulty with urination. He was found to have urinary tract infection on [facility]. He was admitted for further treatment of elevated blood sugars and bladder infection.   PAST MEDICAL HISTORY:  Significant for diabetes mellitus, diabetic neuropathy, neurogenic bladder with urinary retention, history of nephrolithiasis.   ALLERGIES:  COLCHICINE.    SOCIAL HISTORY:  Noncontributory for smoking, alcohol or drug use.   MEDICATIONS ON ADMISSION: Benztropine 1 mg twice daily. Bethanechol 25 mg twice daily. Gabapentin 100 mg 3 times daily. Glimepiride 4 mg daily. Insulin 25 units subcu twice daily. NovoLog insulin 10 units subcu in the evening.   PHYSICAL EXAMINATION: VITAL SIGNS:  Stable. HEENT EXAM: Within normal limits.  CHEST:  Clear to auscultation bilaterally.  CARDIOVASCULAR:  Regular rate and rhythm.  ABDOMEN: Soft, nontender, nondistended. No palpable masses. No appreciable CVA tenderness.  GENITOURINARY:  External genitalia within normal limits. No evidence of testicular mass or hernia. No significant edema or erythema noted.  EXTREMITIES:  Free range of motion x 4.  NEUROLOGIC EXAM:  Motor and sensory grossly intact.   ASSESSMENT:   1.  Chronic  urinary retention.  2.  Nephrolithiasis. 3.  Chronic renal failure.   RECOMMENDATION: An initial renal ultrasound demonstrated possible significant nephrolithiasis. A CT scan was obtained for further evaluation. This was reviewed in detail. There are 2 small punctate stones within the left kidney. These are not significant and do not need any further evaluation or intervention at present. Monitoring over time is all that is indicated. There is no evidence of hydronephrosis or obstruction. The bladder extends to his umbilicus. At the time of CT scan, his bladder residual by ultrasound was less than 200 mL. The bladder itself, however, did appear to be relatively large. Significant bladder wall thickening is present. This is consistent with chronic urinary retention and current acute cystitis. He will need a Foley catheter for drainage and proper treatment for his current urinary tract infection. The catheter will need to be left in place for at least 10 days. Starting Flomax 0.4 mg is also recommended. The bethanechol usually provides minimal overall benefit. He will likely need a voiding trial in approximately 10 days. This can be performed on an outpatient basis. He will likely need further evaluation to determine need for Foley catheter or self-intermittent catheterization. Extensive discussion has been undertaken with the patient regarding this aspect. Failure to do so will result in recurring infections and worsening renal function. Continue with the antibiotic therapy, as you are currently doing. We will monitor him while in house. If there are any further questions, please feel free to contact us.     Madolyn FriezeBrian S. Achilles Dunkope, MD bsc:mr D: 03/22/2013 17:38:30 ET T: 03/22/2013 19:33:05 ET JOB#: [id number]  cc: Madolyn FriezeBrian S. Achilles Dunkope, MD, <Dictator> Madolyn FriezeBrian S. Achilles Dunkope,  MD  Madolyn Frieze Adelin Ventrella MD ELECTRONICALLY SIGNED 03/28/2013 14:28

## 2015-01-10 NOTE — Discharge Summary (Signed)
PATIENT NAMLayne [name]:  [name], [name] L MR#:  [id number] DATE OF BIRTH:  1951-07-02  DATE OF ADMISSION:  04/05/2013 DATE OF DISCHARGE:  04/07/2013  ADDENDUM TO THE DISCHARGE SUMMARY.  DISCHARGING PHYSICIAN:  Cherly Hensenadhika Palisade, M.D.   PRIMARY CARE PHYSICIAN:  Beverely RisenFozia Khan, MD.  ADMITTING PHYSICIAN:  Dr. Mordecai MaesSanchez.  Please look at the discharge diagnosis and discharge medications dictated on April 06, 2013. The only change that was made is the patient's Levaquin has been stopped as there was no indication for any infection. The patient was supposed to be discharged on 04/06/2013 but after the Foley was taken out, he did not urinate by himself and bladder scan after 10 hours revealed about a liter of urine retained in his bladder, so had an in-and-out catheterization and again this morning had another 750 mL in the bladder. This is chronic for the patient, He requests a chronic Foley catheter, however, because of his underlying bipolar, patient has pulled out his catheter twice in the past. So Urology recommended self-catheterization with possible  in-and-outs as needed but for now the patient is being discharged on Foley catheter. Again he was strongly advised and the instructions were given to get the Foley catheter out if it was uncomfortable for the patient and do in-and-outs as needed. Follow with Dr. Achilles Dunkope in 4 to 6 weeks as prior scheduled. His WBC, which was elevated on admission likely secondary stress reaction, has completely normalized at 7.1 prior to discharge at this time. So his antibiotics were stopped and there was no source of infection.   ADDITIONAL TIME SPENT:  35 minutes.  ____________________________ [name] Baasadhika Tesla Bochicchio, MD rk:jm D: 04/07/2013 12:35:00 ET T: 04/07/2013 13:28:26 ET JOB#: [id number]  cc: Lyndon CodeFozia M. Khan, MD [name] Baasadhika Alexiz Cothran, MD, <Dictator> [name] BaasADHIKA Calise Dunckel MD ELECTRONICALLY SIGNED 04/23/2013 15:12

## 2015-01-10 NOTE — H&P (Signed)
PATIENT NAME:  [name], [name] MR#:  [id number] DATE OF BIRTH:  1951/05/09  DATE OF ADMISSION:  05/02/2013  PRIMARY CARE PHYSICIAN: Lyndon Code, MD  REFERRING PHYSICIAN: Dr. Enedina Finner.  CHIEF COMPLAINT: Hypotension today.   HISTORY OF PRESENT ILLNESS: A 64 year old Caucasian male with a history of diabetes, hypertension, was sent from Dr. Pricilla Handler office due to hypotension. The patient came from nursing home. He went to Dr. Pricilla Handler office for followup for diabetes. He was noted to have a low blood pressure at 84/55 and then was sent to ED for further evaluation. The patient's was 118/74 in the ED, but was noted to have orthostatic hypotension. The patient has a history of urinary retention, on chronic Foley. The patient was treated for UTI as outpatient several days ago. The patient denies any fever or chills, but has generalized weakness sometimes. He denies any headache or dizziness. Denies any other symptoms.   PAST MEDICAL HISTORY: Hypertension, diabetes, urinary retention with neurogenic bladder, history of kidney stone.   SOCIAL HISTORY: No smoking, alcohol drinking or illicit drugs. Living in skilled nursing facility.   FAMILY HISTORY: Mother and father died of complication of malignancy. They also had a heart attack. Brother has pancreatic cancer.   ALLERGIES: COLCHICINE.  HOME MEDICATIONS:  1.  Wellbutrin XL 300 mg p.o. once a day for bipolar depression.  2.  Tramadol 50 mg p.o. every 4 hours p.r.n.  3.  NovoLog FlexPen subcutaneous 4 times a day.  4.  Gabapentin 100 mg p.o. t.i.d.  5.  Flomax 0.4 mg p.o. b.i.d.  6.  Dextromethorphan/guaifenesin 10 mg/100 mg per 5 mL oral liquid, 5 mL every 4 hours p.r.n.  7.  Depakote 125 mg p.o. b.i.d.  8.  Colace 100 mg p.o. 1 cap every 12 hours p.r.n. for constipation.  9.  Bethanechol 25 mg p.o. tablets 1 tab t.i.d.  10.  Benztropine 1 mg p.o. b.i.d.  11.  Ativan 0.5 mg p.o. twice a day p.r.n. for anxiety.   REVIEW OF SYSTEMS: CONSTITUTIONAL:  The patient denies any fever or chills. No headache or dizziness, but has generalized weakness.  EYES: No double vision or blurry vision.  EARS, NOSE, THROAT: No postnasal drip, slurred speech or dysphagia.   CARDIOVASCULAR: No chest pain, palpitations, orthopnea or nocturnal dyspnea. No leg edema.  PULMONARY: No cough, sputum, shortness of breath or hemoptysis.  GASTROINTESTINAL: No abdominal pain, nausea, vomiting or diarrhea. No melena or bloody stool.  GENITOURINARY: No dysuria, hematuria, but has incontinence on chronic Foley catheter.  SKIN: No rash or jaundice.  NEUROLOGIC: No syncope, loss of consciousness or seizure.  HEMATOLOGIC: No easy bruising or bleeding.  ENDOCRINE: No polyuria, polydipsia, heat or cold intolerance.   PHYSICAL EXAMINATION: VITAL SIGNS: Temperature 98.4. Blood pressure 108/76 and then 119/74; however, the patient also has orthostatic hypotension with 140/76 on lying position and 94/54 on sitting position and 74/50 on standing position.  GENERAL: The patient is alert, awake, oriented, in no acute distress.  HEENT: Pupils round, equal, reactive to light and accommodation. Moist oral mucosa. Clear oropharynx.  NECK: Supple. No JVD or carotid bruit. No lymphadenopathy. No thyromegaly.  CARDIOVASCULAR: S1, S2, regular rate and rhythm. No murmurs or gallops.  PULMONARY: Bilateral air entry. No wheezing or rales. No use of accessory muscles to breathe.  ABDOMEN: Soft. No distention or tenderness. No organomegaly. Bowel sounds present.  EXTREMITIES: No edema, clubbing or cyanosis. No calf tenderness. Strong bilateral pedal pulses.  SKIN: No rash or jaundice.  NEUROLOGIC: The patient is alert, awake, oriented, in no acute distress. No focal deficit. Power 5 out of 5. Sensation intact.   LABORATORY, DIAGNOSTIC AND RADIOLOGICAL DATA: Chest x-ray showed no evidence of acute cardiopulmonary abnormality. Urinalysis showed nitrites negative, WBC 48 RBC 35. CBC showed WBC 12.4,  hemoglobin 12.6, platelets 329. Glucose 260, BUN 28, creatinine 2.08. Electrolytes are normal. Lipase 47. Troponin less than 0.02. EKG showed normal sinus rhythm with 98 BPM.   IMPRESSIONS: 1.  Hypotension.  2.  Orthostatic hypotension.  3.  Acute renal failure with dehydration on chronic kidney disease.  4.  Urinary tract infection.  5.  Leukocytosis.  6.  Diabetic neuropathy.  7.  History of urinary retention with neurogenic bladder.  8.  History of kidney stone.   PLAN OF TREATMENT: 1.  The patient will be admitted to the medical floor. We will continue normal saline IV. The patient was treated with normal saline bolus and IV in ED. We will follow up her BMP. 2.  For UTI, we will start Rocephin IVPB. Follow up urine culture, CBC and blood culture.  3.  For diabetes, we will start sliding scale. May need basal insulin depending on the patient's blood sugar.  4.  We will get a physical therapy evaluation.  5.  I discussed the patient's condition and plan of treatment with the patient and health giver.  CODE STATUS: Full code.   TIME SPENT: About 57 minutes.    ____________________________ Shaune PollackQing Kynsley Whitehouse, MD qc:jm D: 05/02/2013 17:20:00 ET T: 05/02/2013 19:06:55 ET JOB#: [id number]  cc: Shaune PollackQing Jayne Peckenpaugh, MD, <Dictator> Shaune PollackQING Rachele Lamaster MD ELECTRONICALLY SIGNED 05/04/2013 16:48

## 2015-01-10 NOTE — Consult Note (Signed)
Chief Complaint:  Subjective/Chief Complaint Pt reports dysphagia resolved.  Denies heartburn or indigestion.  Eating well.  EGD showed hemorrhagic gastropathy, benign-appearing esophageal stricture s/p dilation, tortuous esophagus.   VITAL SIGNS/ANCILLARY NOTES:  **Vital Signs.:   01-May-14 05:20  Vital Signs Type Routine  Temperature Temperature (F) 98.1  Celsius 36.7  Temperature Source oral  Pulse Pulse 79  Respirations Respirations 18  Systolic BP Systolic BP 432  Diastolic BP (mmHg) Diastolic BP (mmHg) 71  Mean BP 87  Pulse Ox % Pulse Ox % 94  Pulse Ox Activity Level  At rest  Oxygen Delivery Room Air/ 21 %   Brief Assessment:  Cardiac Regular    Respiratory normal resp effort    Gastrointestinal Normal    Gastrointestinal details normal Soft  Nontender  Nondistended  No masses palpable  Bowel sounds normal    Additional Physical Exam Gen: A/Ox3. NAD. Ext:  No LEE.   Lab Results:  Routine Chem:  01-May-14 04:10   Glucose, Serum  244  BUN 18  Creatinine (comp)  1.61  Sodium, Serum 139  Potassium, Serum 4.3  Chloride, Serum 106  CO2, Serum 30  Calcium (Total), Serum  8.4  Anion Gap  3  Osmolality (calc) 288  eGFR (African American)  53  eGFR (Non-African American)  45 (eGFR values <22m/min/1.73 m2 may be an indication of chronic kidney disease (CKD). Calculated eGFR is useful in patients with stable renal function. The eGFR calculation will not be reliable in acutely ill patients when serum creatinine is changing rapidly. It is not useful in  patients on dialysis. The eGFR calculation may not be applicable to patients at the low and high extremes of body sizes, pregnant women, and vegetarians.)   Assessment/Plan:  Assessment/Plan:  Assessment Dysphagia s/p esophageal dilation: Doing well. GERD: well controlled on PPI   Plan Continue PPI daily Call if any further problems   Electronic Signatures: JAndria Meuse(NP)  (Signed 01-May-14  08:50)  Authored: Chief Complaint, VITAL SIGNS/ANCILLARY NOTES, Brief Assessment, Lab Results, Assessment/Plan   Last Updated: 01-May-14 08:50 by JAndria Meuse(NP)

## 2015-01-11 NOTE — Consult Note (Signed)
Urology Consultation Report for Consultation: Gross Hematuria MD: Enid Baasadhika Kalisetti, MDMD: Marin OlpJay H. Malayiah Mcbrayer, MD 64 y.o. [facility] Resident who was admitted 10/19/2013 for gross hematuria.  The pt had just been discharged 2 days prior following an admission for gout.  The pt reports the onset of gross hematuria approximately 4-5 days ago, which was associated with pain in the glans penis. The pt reports resolution of the pain with the removal of his existing foley and replacement with a 24 French 3 way foley in the ED (100mL of grossly bloody urine obtained). Hct 26.7%, Cr 1.8 (h/o CKD 3), UC&S neg to date.  Stone CT Abd/Pelvis: Decompressed bladder with foley in good position with probable clot and diffuse thickening of the bladder wall, b/L nephrolithiasis without hydronephrosis. was admitted for CBI/IV Hydration with serial Hgb/Hct. Hct this AM 24.9% with improvement in Cr to 1.43. currently denies any pain.  Pt is a difficult historian with poor insight and memory. Partially oriented to place and date (March 2015, "in the hospital" - could not name the hospital).  Pt denies any established relationship with a local urologist. Pt does not know why he is unable to void.  Pt denies regular foley changes; reports only occasional foley changes that take place in the ER.  Pt denies any smoking history, endorses a distant h/o urolithiasis per the detailed H&P by Dr. Nemiah CommanderKalisetti dated 10/20/2103 at 15:02. T (97.6) P (68) RR (17) BP (125/71) RA Sat (98%)WD, thin WM in NADWarm/Dry, no lesions about the Head/NeckNC/AT, anicteric, EOMIno masses, no bruitsCTA, nL Resp EffortRRR without M/G/R, 1+ Carotid/Radial b/LNT/ND, NABS, no palpable masses/no organomegalynL circ phallus with foley in place draining clear urine on moderate CBI; b/L descended testes - NT, no massesNST, 1+ Prostate - NT, no nodulesno edemanon-focalAlert and Oriented to Person, Place, Reason for Admission, mild confusion regarding the Date  (March 2015) per HPI (CT personally reviewed)   Gross Hematuria - requiring moderate CBI to remain clear; hemodynamically stable - no current indication for urologic interventionAnemia - remains hemodynamically stable; decrease in Hct may be partially due to IV hydration (see #3)CKD - Cr down to 1.43, likely due to hyperfiltration effect with IV hydrationChronic Urinary Retention - etiology unclear, however, likely due to IDDM (also has diabetic neuropathy rendering pt wheelchair dependent, CKD)  Agree with CBIAgree with serial Hgb/Hct - transfuse as neededCystoscopy as an outpatient (no current indication for Urologic Intervention) follow with you.  Electronic Signatures: Marin OlpKim, Aman Bonet H (MD)  (Signed on 31-Jan-15 13:52)  Authored  Last Updated: 31-Jan-15 13:52 by Marin OlpKim, Berkeley Veldman H (MD)

## 2015-01-11 NOTE — H&P (Signed)
PATIENT NAME:  [name], [name] MR#:  [id number] DATE OF BIRTH:  12-25-1950  DATE OF ADMISSION:  12/08/2013  REFERRING PHYSICIAN: Dr. Fanny Bien  FAMILY PHYSICIAN: Dr. Beverely Risen.   REASON FOR ADMISSION: Altered mental status with fever.   HISTORY OF PRESENT ILLNESS: The patient is a 64 year old male with a long-standing history of dementia, diabetes, and bipolar disorder, discharge from this facility approximately 11 days ago with SIRS. He represented presents now with fever and altered mental status. In the Emergency Room, the patient was febrile and tachycardic. His white count was severely elevated. X-ray suggests pneumonia and he is now admitted for further evaluation. The patient is unable to give history.   PAST MEDICAL HISTORY: 1.  Recent encephalopathy from sepsis.  2.  Lactic acidosis.  3.  Left wrist gout versus cellulitis.  4.  Recent fungemia.  5.  Type 2 diabetes.  6.  Decubitus ulcerations stage II.  7.  Chronic indwelling Foley catheter.  8.  Neurogenic bladder.  9.  Chronic anemia.  10.  Diabetic neuropathy. 11.  Chronic renal insufficiency.  12.  Restless leg syndrome.  13.  Bipolar disorder.  14.  Depression. 15.  Essential tremor.   MEDICATIONS: 1.  Ativan 0.5 mg p.o. t.i.d.  2.  Seroquel 300 mg p.o. at bedtime.  4.  Flomax 0.4 mg p.o. daily.  5.  Gabapentin 100 mg p.o. t.i.d.  6.  Geodon 80 mg p.o. b.i.d.  7.  Allopurinol 100 mg p.o. daily.  8.  Propranolol 20 mg p.o. daily.  9.  Iron sulfate 325 mg p.o. t.i.d.  10.  Multivitamin 1 p.o. daily.  11.  Depakote 1000 mg p.o. daily. 12.  Januvia 50 mg p.o. daily.  13.  Levemir 15 units subcutaneous at bedtime.  14.  Protonix 40 mg p.o. daily.  15.  Colace 100 mg p.o. b.i.d.   ALLERGIES: COLCHICINE.   SOCIAL HISTORY: There is no apparent history of alcohol or tobacco abuse.   FAMILY HISTORY: Positive for coronary artery disease per old records.   REVIEW OF SYSTEMS:  Unable to obtain from the patient.    PHYSICAL EXAMINATION: GENERAL: The patient is acutely ill-appearing.  VITAL SIGNS: Remarkable for a blood pressure of 136/75 with heart rate of 106, respiratory rate of 18, temperature of 101.3.  HEENT: Normocephalic, atraumatic. Pupils equally round and reactive to light and accommodation. Extraocular movements are intact. Sclerae are anicteric. Conjunctivae are clear.  OROPHARYNX: Dry, but clear.  NECK: Supple without JVD. No thyromegaly.  LUNGS: Reveal course rhonchi bilaterally with dullness at the left base. No wheezes. Respiratory effort is increased.  CARDIAC: Rapid rate with a regular rhythm. Normal S1, S2. No significant murmurs.  ABDOMEN: Soft, nontender, with normoactive bowel sounds. No organomegaly or masses were appreciated. No hernias or bruits were noted.  EXTREMITIES: Revealed a decubitus ulcer on the sacrum on the right. Pulses were 2+ distally.  NEUROLOGIC: Revealed patient who was lethargic but would open his to a name. Did not follow other commands. Reflexes were 2+ symmetrically.  PSYCHIATRIC: Unable to be performed.   LABORATORY DATA: Troponin was less than 0.02.  Magnesium 1.9. Glucose 247 with a BUN of 29, creatinine 1.62 with a sodium of 140, potassium 4.5 and a GFR of 45. White count was 25.7 with a hemoglobin of 9.9. Urinalysis revealed trace bacteria with 11 WBCs per high-power field. Chest x-ray revealed left lower lobe infiltrate consistent with pneumonia.   ASSESSMENT: 1.  Systemic inflammatory response syndrome.  2.  Facility acquired pneumonia.  3.  Encephalopathy from infection.  4.  Anemia of chronic disease.  5.  Stage II chronic kidney disease.  6.  Type 2 diabetes.  7.  Decubitus ulceration.  8.  History of recent fungemia.   PLAN: The patient will be admitted to the floor with off unit telemetry as a NO CODE BLUE, DO NOT RESUSCITATE. Blood and urine cultures have been sent off. He will be started on IV antibiotics, oxygen and DuoNeb SVNs. He will  also be placed on IV steroids. We will follow his sugars closely. We will obtain a speech therapy consult due to the possibility of aspiration. He will be kept n.p.o. except for medications and ice chips. Follow-up chest x-ray and routine labs in the morning. Social work consult for placement since he is from a facility. Neuro checks q. 4 hours. Further treatment and evaluation will depend upon the patient's progress.   Total time spent on this patient: 50 minutes.    ____________________________ Duane LopeJeffrey D. Judithann SheenSparks, MD jds:dp D: 12/08/2013 12:54:50 ET T: 12/08/2013 13:25:38 ET JOB#: [id number]  cc: Duane LopeJeffrey D. Judithann SheenSparks, MD, <Dictator> Lyndon CodeFozia M. Khan, MD Kristien Salatino Rodena Medin Tishia Maestre MD ELECTRONICALLY SIGNED 12/08/2013 15:28

## 2015-01-11 NOTE — Discharge Summary (Signed)
PATIENT NAMEPAIDEN, [name] MR#:  [id number] DATE OF BIRTH:  1950-10-30  DATE OF ADMISSION:  10/13/2013 DATE OF DISCHARGE:  10/16/2013  DISCHARGE DIAGNOSES:  1. Acute gout flare.   2. Acute on chronic renal failure.  3. Neurogenic bladder.  4. Metabolic encephalopathy.  5. Hypertension.  6. Diabetes mellitus type 2.  7. Dementia.  8. Essential hand tremors.  DISCHARGE MEDICATIONS:  1. Ativan 0.5 mg p.o. t.i.d. 2. Midodrine 10 mg p.o. t.i.d.  3. NovoLog 70/30 34 units once a day.  4. Seroquel 300 mg at bedtime.  5. Tamsulosin 0.4 mg p.o. daily.  6. Depakote 125 mg 8 capsules that is 1000 mg once a day.  7. Gabapentin 100 mg p.o. t.i.d.  8. Lantus 20 units once a day at bedtime. 9. Geodon 80 mg p.o. b.i.d.  10. Polyethylene glycol as needed for constipation. 11. Propranolol 10 mg p.o. daily. This is a new medicine.  12. Allopurinol 100 mg p.o. daily.  13. Augmentin 1 tablet p.o. b.i.d.  14. Prednisone 20 mg 3 tablets daily for 2 days, 2 tablets daily for 2 days, 1 tablet daily for 2 days and then stop.   CONSULTATIONS: Physical therapy consult.   DISPOSITION: The patient is discharged to [facility] assisted living facility, and he will resume the physical therapy there. The patient was given Augmentin for about 10 days.   HOSPITAL COURSE: A 64 year old male patient with neurogenic bladder, has chronic Foley, who came in because of fall and left wrist pain. The patient has history of gout,, diabetes, hypertension, neurogenic bladder, history of kidney stones, and he is brought from nursing home because of the fall and wrist pain and also he was confused.   1. Left wrist pain. The patient did not have any fracture. He had arthrocentesis from the left wrist which showed monosodium urate crystals, and the patient's uric acid also is elevated. The patient thought to have acute gout flare. The patient's ESR also was elevated. When he came, the patient's ESR was 106. The patient started  on steroids and morphine and also allopurinol. The patient was taking allopurinol before, so we restarted the allopurinol. The patient nicely improved, his swelling improved, his tenderness in the left hand and left wrist improved. The patient was given prescription for prednisone and also restart the allopurinol. At the time of discharge, he did not have any pain or tenderness.  2. Acute on chronic renal failure. The patient's BUN of 43, creatinine 2.06, so we did not give him any NSAIDs for acute gout. The patient did receive IV fluids, and his creatinine stabilized at 1.58 at the time of discharge. 3. The patient's arthrocentesis showed no infection there.  4. Suspected urinary tract infection. The patient does have chronic Foley, and leukocyte esterase 2 and then WBC 16 on admission. The patient's urine culture showed only Candida. Because he was symptomatic, we gave him Augmentin.  5. Metabolic encephalopathy secondary to gout and also renal failure, which improved nicely. The patient became very alert the following day, and the patient did not have any confusion when I saw him the first time and able to answer questions appropriately.  6. History of hand tremors. The patient did have terrible hand tremors, so I started him on propranolol 10 mg daily, and that helped him, and his hand tremors decreased.  7. History of diabetes. The patient is on Lantus and also 70/30 34 units once a day and Lantus of 20 units at bedtime. The  patient's sugars have been like in the 150s to 200 range, likely secondary to steroids, so we discharged him on the same medication.  8. Fever. The patient did have fever when he was admitted, and temperature was 101.9 on the 25th of January. His flu test is negative. He did not have any further fevers. His fever thought to be probably due to urinary tract infection and also like viral syndrome as well. The patient did much better the following days, and his blood pressure has been  stable with 131/79, heart rate of 70 and temperature 97.8 at the time of discharge.  DISPOSITION: He is discharged back to [facility] assisted living facility.   TIME SPENT ON DISCHARGE PREPARATION: More than 30 minutes.  PRIMARY DOCTOR: [name], MD  ____________________________ [name] Lesches, MD sk:lb D: 10/18/2013 08:48:06 ET T: 10/18/2013 09:09:14 ET JOB#: [id number]  cc: [name] Lesches, MD, <Dictator> [name] Lesches MD ELECTRONICALLY SIGNED 10/30/2013 15:30

## 2015-01-11 NOTE — Discharge Summary (Signed)
PATIENT NAMLayne [name]:  [name], [name] MR#:  [id number] DATE OF BIRTH:  01/09/1951  DATE OF ADMISSION:  10/26/2013 DATE OF DISCHARGE:  11/02/2013  DISCHARGE DIAGNOSES:  1.  Systemic inflammatory response syndrome due to fungemia, ruled out all other infections, Clostridium difficile; x-ray of the chest, urinalysis and blood culture for bacterial infections are negative. 2.  Fungemia, Candida glabrata. Needs micafungin until 23 February.  3.  Hypoglycemia episode. Needed D10 drip, but resolved. 4.  Diabetes. Decreased dose of Levemir as he was hypoglycemic.  5.  Chronic Foley catheter, neurogenic bladder.  6.  Hematuria. Needs to follow up with urologist in the clinic in 1 to 2 weeks.  7.  Chronic anemia.  8.  Gout.  9.  Acute on renal failure on presentation, improved.   CONDITION ON DISCHARGE: Stable.   DISCHARGE MEDICATIONS:  1.  Lorazepam 0.5 mg oral tablet, take 1 tablet 3 times a day.  2.  Quetiapine 300 mg oral tablet once a day. 3.  Flomax 0.4 mg oral capsule once a day.  4.  Valproic acid 125 mg oral delayed-release 8 capsules once a day.  5.  Gabapentin 100 mg 3 times a day.  6.  Allopurinol 100 mg oral tablet once a day.  7.  Loperamide 2 mg oral tablet every 4 hours as needed for diarrhea.  8.  Propranolol 20 mg oral tablet once a day.  9.  Ferrous sulfate 325 mg 3 times a day.  10.  Prednisone 10 mg oral tablets, start at 60 and taper by 5 mg daily until complete.  11.  Insulin 20 units subcutaneous once a day.  12.  Micafungin 100 mg once a day for 10 days.  13.  Potassium chloride 20 mEq oral tablet once a day for 5 days.   DISCHARGE INSTRUCTIONS: Advised to have Foley catheter and PICC line care as routine after discharge.   ACTIVITY: As tolerated.   TIMEFRAME TO FOLLOW-UP: Within 1 to 2 weeks with urologist's office, and PMD's office in 2-3 weeks.   HISTORY OF PRESENTING ILLNESS: A 64 year old Caucasian male with past medical history significant for diabetes, was admitted  in hospital on 30 January and discharged on 3 February with hematuria diagnosis, advised to follow in urology clinic for further workup. He was sent back from nursing home with high fever. His blood pressure noted in the Emergency Room was 59/30, and also noted to have some hematuria. After 2 liters of saline in the Emergency Room, his blood pressure was still systolic in the 70s, and so he was given for admission to medical team.   HOSPITAL COURSE AND STAY: For SIRS, at time of presentation, all the work-up was done and he was started on broad-spectrum antibiotic. He was on Levophed also on admission, but later on his blood culture was reported positive with fungemia, so antibiotics were stopped and he was continued on micafungin. His chest x-ray, urinalysis, stool for C. difficile were negative. As per infectious disease specialist, he needed to have micafungin at least for 15 days, and so we placed PICC line and sent him to nursing home with further antibiotic therapy to finished over there.   OTHER MEDICAL ISSUES: 1.  Hypoglycemia. The patient had episode of hypoglycemia. Blood sugar dropped to 15 on 8 February. He was started on D10 drip, but later on blood sugar levels came under control and stayed stable, so we stopped D10. We were able to restart his insulin, but we started with some cut-down  dose.  2.  Chronic anemia. He had to undergo blood transfusion. Hemoglobin dropped to 6.3. Stool for guaiac was negative.  3.  Acute on chronic renal failure, due to dehydration and SIRS. After IV hydration, it remained stable.  4.  Rhabdomyolysis. He was weak and not moving enough, so elevated CK level on admission, but with IV fluids it came down.  5.  Neurogenic bladder and chronic Foley. Remained stable in the hospital, and on discharge we advised to follow with urology.  6.  Diarrhea. Stool for C. difficile was negative. Stool cultures were negative. Started on Imodium, and his diarrhea stopped. PICC line  was started.   CONSULTS IN THE HOSPITAL: Infectious disease with Dr. Sampson Goon. Neurology with Dr. Loretha Brasil.   IMPORTANT LABORATORY RESULTS IN THE HOSPITAL: On presentation, white cell count was 33,000 and hemoglobin was 9.1, platelet was 297. Creatinine was 2.42, sodium 140, and potassium 5.3 on admission. Blood culture on admission came positive for Candida glabrata in both the cultures. Influenza A and B were negative. Stool culture was negative. Stool for C. difficile was negative. Troponin was 0.65. Urine culture was negative on admission. Creatinine improved to 1.7 on 7 February. WBC count came down to 23,000, and creatinine improved to 1.25 later on, on 8 February. Occult blood was negative on 9 February. HIV test was negative. Blood culture repeated on 9 February was negative. Finally, on 12 February, his creatinine was 1.14 and WBC was 11.6, hemoglobin was 11.2.   TOTAL TIME SPENT ON THIS DISCHARGE: 40 minutes.    ____________________________ Hope Pigeon Elisabeth Pigeon, MD vgv:[id number] D: 11/06/2013 00:32:19 ET T: 11/06/2013 04:09:56 ET JOB#: [id number]  cc: Hope Pigeon. Elisabeth Pigeon, MD, <Dictator> Burley Saver, MD     Altamese Dilling MD ELECTRONICALLY SIGNED 11/18/2013 0:51

## 2015-01-11 NOTE — Discharge Summary (Signed)
PATIENT NAME:  [name], [name] MR#:  [id number] DATE OF BIRTH:  1951/09/13  DATE OF ADMISSION:  12/08/2013 DATE OF DISCHARGE:  12/10/2013  ADMITTING DIAGNOSIS: Shows aspiration pneumonia.   DISCHARGE DIAGNOSES: 1. Aspiration pneumonitis.  2. Acute respiratory failure due to aspiration pneumonitis.  3. Dehydration, acute on chronic renal failure. 4. Severe malnutrition.  5. Chronic kidney disease   6. Diabetes mellitus.  7. Multiple admissions for aspiration pneumonitis due to dysphagia.  8. History of encephalopathy in the past.  9. Lactic acidosis.  10. History of gout, neurogenic bladder,   cellulitis.  11. Fungemia.  12. Decubitus ulceration stage 2.  13. Chronic indwelling Foley catheter.  14. Neurogenic bladder.  15. Chronic anemia.  16. Diabetic neuropathy.  17. Chronic renal insufficiency. Shock 18. Gastroesophageal reflux disease.   19. Bipolar disorder.  20. Depression.  21. Essential tremor.   DISCHARGE CONDITION: Poor.   DISCHARGE MEDICATIONS: The patient is to continue lorazepam 0.5 milligrams 3 times daily, Seroquel 300 milligrams orally at bedtime, tamsulosin 0.4 milligrams orally daily, gabapentin 100 milligrams orally 3 times daily with meals, Geodon 80 milligrams orally twice daily, propranolol 10 milligrams orally daily, divalproex sodium 500 milligrams orally 2 tablets once daily, acetaminophen 650 milligrams rectal suppository rectally  every 4 hours as needed, morphine 20 milligrams in 1 mL milliliter oral concentrate, 0.5 milliliters every 4 hours as needed. The patient is not to take allopurinol, iron sulfate, multivitamins, sitagliptin, insulin, detemir, glucagon, pantoprazole, Colace, Glucerna, or NovoLog.   No followup was recommended.   CONSULTANTS: Care management, social work, palliative care.   RADIOLOGIC STUDIES: Chest x-ray, portable single view on the 12/08/2013, revealed areas of increased density within the left retrocardiac and left costophrenic  angle regions. Differential considerations atelectasis versus infiltrate. Further evaluation with dedicated PA and lateral views were recommended. CT scan of head without contrast 12/08/2013, revealed mild diffuse cortical atrophy, mild chronic ischemic white matter disease, no acute intracranial abnormality were seen. CT of abdomen and pelvis without contrast 12/08/2013, revealed a dense consolidation in left lower lobe, also airspace disease in left upper lobe, right upper lobe and right middle lobe. This is likely source of patient's fever. Consider aspiration pneumonia. Persistent fecal impaction within the rectum. Hyperdense material was in the bladder has passed and the bladder is now decompressed by the Foley catheter according to radiology. Repeated chest x-ray, portable single view on 12/09/2013, revealed infiltrate and marked atelectasis of the left lung base.   The patient is a 64 year old Caucasian male who presented to the hospital on the 12/08/2013 with fevers. Please refer to Dr. Judithann Sheen admission note on the 12/08/2013. Apparently, he was recently discharged from the hospital and comes back with fever as well as altered mental status. He was tachycardic in the Emergency Room and his chest x-ray revealed pneumonia. He was admitted for further treatment. The patient himself was not able to provide any history.   On physical exam his blood pressure 136/75 heart rate is 106. Respiration rate was 18, and temperature was 101.3.  Lungs revealed coarse sounds bilaterally with dullness at the left base, but no wheezes. Respiratory effort was increased. Patient did have a decubitus ulcer on the sacrum on the right and pulses were 2+ distally and he was lethargic but was able to open his eyes to the name.  He was not able to follow any commands.   His chest x-ray revealed pneumonia. His labs 12/08/2013: Revealed elevation of BUN and creatinine to 29 and 1.62, glucose  247, otherwise BMP was unremarkable.  The patient's lipase level was normal at 36, magnesium level was normal at 1.9. Liver enzymes revealed albumin level of 2.2, otherwise liver enzymes were normal. Cardiac enzymes were normal. Valproic acid level was 15. White blood cell count was elevated to 25.7, hemoglobin was 9.9, platelet count was 239,000 and absolute neutrophil count was 22.3. Coagulation panel was unremarkable. Blood cultures taken 12/08/2013 did not show any growth. Urinalysis revealed 164 red blood cells, 11 white blood cells. Blood    cultures were negative. VBGs were performed on room air, showed pH of 7.39, pCO2 was 47.. Lactic acid level was 1.8 and saturation was 91%. The patient's EKG showed sinus tachycardia at 113 beats per minute, septal infarct which was cited before 10919 th of   February 2015, no acute ST-T changes were noted.   HOSPITAL COURSE: The patient was admitted to the hospital for further evaluation. He was started on Zosyn as well as IV fluids. With this, his condition somewhat improved. He was able to open his eyes and he was able to keep an awake state for a little bit longer period of time. After rehydration, he was noted to be severely hypoalbuminemic signifying significant malnutrition. His white blood cell count improved by 12/10/2013 to 13.5 ; however, his hemoglobin dropped down to 8.7 from prior 9.1 on the day of admission. It was felt that he is overall improving; however, upon further discussion with family, it appears that the patient's family was considering palliative care as well as comfort care. Consultation with palliative care was obtained and the patient's family was agreeable to send patient to hospice home where he will be discharged today on the 12/10/2013.   On the day of discharge, the patient's vital signs, temperature was 98.1, pulse was 81, respirations was 18, blood pressure 149/76, saturation was 96% on room air at rest. Initially the  patient required Ventimask on admission. He was weaned  off oxygen by the day of discharge, the 12/10/2013.   He was evaluated by speech therapist as mentioned above prior to discharge and modified barium swallowing study was recommended. However, since the patient is being discharged to hospice home that will be recommended for him upon discharge. Medications should be also be given in apple souce  if he is not able to take orally.   TIME SPENT: 40 minutes.   ____________________________ Katharina Caperima Derrick Tiegs, MD [phone] [id number] D: 12/10/2013 18:14:53 ET T: 12/10/2013 21:39:50 ET JOB#: [id number]  cc: Katharina Caperima Shayleigh Bouldin, MD, <Dictator> Vasiliki Smaldone MD ELECTRONICALLY SIGNED 12/24/2013 12:55

## 2015-01-11 NOTE — H&P (Signed)
PATIENT NAME:  [name], [name] MR#:  [id number] DATE OF BIRTH:  01/08/1951  DATE OF ADMISSION:  10/26/2013  PRIMARY CARE PHYSICIAN: Beverely Risen, MD  HISTORY OF PRESENT ILLNESS: The patient is a 64 year old Caucasian male with past medical history significant for history of diabetes mellitus, brittle diabetes with hypoglycemic episodes, also diabetic neuropathy, urinary retention and neurogenic bladder who had a Foley catheter placed approximately a year ago and was admitted to the hospital on the 30th of January 2015 and discharged on the 3rd of February 2015.  He comes back to the hospital poorly responsive and having high fevers. Unfortunately, the patient himself is not able to provide much history. All history is obtained from Emergency Room physician as well as family. Apparently the patient was well yesterday as well as today in the morning, however, he was noted to have high fevers to 103, and he was brought to the Emergency Room for further evaluation. In the Emergency Room, his systolic blood pressure was initially registered at 59/30 and he was noted to have some hematuria. Apparently he had his Foley catheter just replaced today. He was also noted to have a large amount of watery stool in the Emergency Room; however, the patient's family denies any diarrhea earlier in the facility. After 2 liters of IV fluids, the patient's systolic blood pressure remains very low in the 70s. However, the patient seems to be a little bit more responsive, according to the Emergency Room physician. Hospitalist services were contacted for admission.   PAST MEDICAL HISTORY: Significant for history of admission at end of January beginning of February 2015 with hematuria which was felt to be due to Foley catheter use, also anemia. He was discharged with hemoglobin level of 7.8, did not require any transfusion. History of insulin-dependent diabetes mellitus, generalized weakness due to anemia, history of dementia, history of  gout and is being tapered on steroids. Also history of diabetic neuropathy, urinary retention, neurogenic bladder, status post chronic Foley catheter placement, CKD with baseline creatinine around 2, restless leg syndrome, bipolar depression, ladder wall thickening which was noted on prior CT scan, multiple urinary tract infections, worsening dementia, also gout flare which happened at the end of January 2015 for which the patient is using steroids, as well as essential hand tremor.  PAST SURGICAL HISTORY: None.   ALLERGIES: COLCHICINE.  MEDICATIONS: According to  medical records, the patient is on acetaminophen/hydrocodone 325/5 mg 1 tablet every 6 hours as needed, allopurinol 100 mg p.o. daily, divalproex sodium 125 mg 8 capsules which would be 1 gram once daily, iron sulfate 325 mg p.o. 3 times daily, gabapentin 100 mg p.o. 3 times daily, sliding scale insulin, insulin Detemir, 25 units subcutaneously daily, loperamide 2 mg 1 tablet every 4 hours as needed, midodrine 10 mg 3 times daily, multivitamins once daily, prednisone taper was initiated on the 3rd of February 2015 to take through the 15th of February 2015, propranolol 20 mg p.o. daily, Seroquel 300 mg p.o. bedtime, tamsulosin 0.4 mg p.o. daily, ziprasidone which is Geodon 80 mg p.o. twice daily.   SOCIAL HISTORY: Resides in [facility] assisted-living facility. He is wheelchair bound at baseline, cannot walk due to his neuropathy as well as weakness. No history of smoking or alcohol abuse currently.   FAMILY HISTORY: Heart disease runs in family according to medical records and the patient's parents had unknown cancer. The patient's brother had pancreatic cancer.  REVIEW OF SYSTEMS: Not available as the patient is unresponsive.  PHYSICAL EXAMINATION:  VITAL SIGNS:  On arrival to the hospital, temperature was 103.3, pulse 104, respiration rate 18, blood pressure 59/41, and saturation 96% on room air.  GENERAL: This is a well-developed,  well-nourished, pale, Caucasian male in no significant distress, lying on the stretcher.  HEENT: Pupils are equal, 2 mm, reactive to light. Extraocular movements are intact. He is not responding to verbal or painful stimuli, however, he was able to squeeze fingers according to the Emergency Room physician. Normal hearing. No pharyngeal erythema. Mucosa is very dry.  NECK: No masses. Supple and nontender. Thyroid not enlarged. No adenopathy. No JVD or carotid bruits bilaterally. Full range of motion.  LUNGS: Crackles and rales bilaterally. A few rhonchi. Somewhat diminished breath sounds, especially on the right side. No significant wheezing. No labored inspirations, increased effort, dullness to percussion, or overt respiratory distress. CARDIOVASCULAR: S1 and S2 appreciated. No murmurs, gallops or rubs noted. PMI not lateralized. Chest is nontender to palpation.  EXTREMITIES: 1+ pedal pulses. No lower extremity edema, calf tenderness, or cyanosis was noted, however, the patient does have very cool distal  areas of his feet as well as his hands.  ABDOMEN: Soft, nontender. Bowel sounds are present. No hepatosplenomegaly or masses were noted.  RECTAL: Deferred.  MUSCLE STRENGTH: Not able to assess as the patient is poorly responsive; however, he was not able to respond to painful stimuli to me, to sternal rub, however, according to the Emergency Room physician he was able to squeeze hand or fingers earlier. SKIN: Did not reveal any rashes, lesions, erythema, nodularity, or induration. It was warm and dry to palpation. No adenopathy in cervical region.  NEUROLOGIC: Cranial nerves grossly intact; however, again evaluation of the patient is limited. Not able to assess sensory. I am not able to assess him for dysarthria or aphasia. The patient seemed to be alert. He has opened his eyes. However, he is not cooperative and he is not responding with pain stimuli. He is nonverbal at this point.   LABORATORY AND  DIAGNOSTICS: BMP showed elevated glucose to 106, BUN and creatinine were 57 and 2.42 as opposed to creatinine of 1.27 on the 2nd of February 2015. The patient's potassium level is high at 5.3. Bicarbonate level was 22. Estimated GFR for non-African American would be 28. The patient's liver enzymes: Albumin level of 2.8, alkaline phosphatase 144, and AST 42. The patient's white blood cell count is elevated to 33.7 thousand and hemoglobin level of 9.1 as opposed to 7.8 on the 3rd of February 2015. Absolute neutrophil count was high at 31.1.   ABG: PH 7.28, pCO2 35, pO2 76, and saturation 93.4% with bicarbonate level of 16.4 and lactic acid level of 3.4 on room air.   EKG: Sinus tach at 105 beats per minute, normal axis, QS in inferior leads, age undetermined. No acute ST-T changes however were noted in comparison to prior EKG done on the 30th of January 2015. No significant change was noted.   ASSESSMENT AND PLAN: 1.  Sepsis. Admit the patient to the medical floor, critical care unit. Get blood cultures as well as urine as well as stool cultures. Start the patient on broad-spectrum antibiotic therapy at this time. We will also get flu testing done.  2.  Acute renal failure. We will continue IV fluids. We will get ultrasound of kidneys. 3.  Shock. Will continue Levophed as well as IV fluids. 4.  Hyperkalemia. Initiate bicarbonate drip.  5.  Acidosis. Likely due to hypertension, poor perfusion. We will continue  IV fluid, will continue pressors as well as bicarbonate IV.   TIME SPENT: One and 15 minutes.    ____________________________ Katharina Caper, MD rv:sb D: 10/26/2013 15:58:22 ET T: 10/26/2013 17:08:20 ET JOB#: [id number]  cc: Katharina Caper, MD, <Dictator> Lyndon Code, MD Rashee Marschall MD ELECTRONICALLY SIGNED 11/21/2013 21:03

## 2015-01-11 NOTE — Consult Note (Signed)
PATIENT NAME:  [name], [name] MR#:  [id number] DATE OF BIRTH:  [birth year]  DATE OF CONSULTATION:  11/09/2013  REFERRING PHYSICIAN:  Shreyang H. Allena KatzPatel, MD CONSULTING PHYSICIAN:  A. Wendall MolaMelissa Nolyn Swab, MD  CHIEF COMPLAINT: Recurrent hypoglycemia.   HISTORY OF PRESENT ILLNESS: This is a 64 year old male with a history of diabetes, managed with insulin, who presented to the ER from his nursing facility yesterday with altered mental status and hypoglycemia. Initial blood sugar in the ED was 34. The patient was treated initially with amps of D50 and IV dextrose, which was stopped yesterday around 5:00 p.m. Overnight, blood sugars were fairly high and in the range of 267 to 523 up until mid-morning today. Today, he has received a total of 22 units of insulin (NovoLog), and sugars have been in the range of 182 to 523.   He was discharged from this facility just 6 days prior after admission for fungemia. During that hospitalization, he had had hypoglycemia as well. Discharge instructions were to give Levemir 20 units at bedtime. I have reviewed his MAR from the nursing facility.  He had also had a flare of gout and had been on a high-dose prednisone taper, beginning on 02/13 to start with 60 mg and taper down by 5 daily. In the last few days. I believe he had been receiving Levemir 23 units at bedtime and a NovoLog insulin sliding scale of 2 units per 50 over a target of 150. It appears they were monitoring blood sugars 4 times daily and frequently dosing with 10 units of that sliding scale. I also see he had a morning fasting low a few days ago in the 40s.   The patient is a poor historian. He has severe bipolar disorder and dementia. History obtained from chart review. He has a long history of diabetes for at least 9 years. Diabetes has been uncontrolled in the past, although hemoglobin A1c earlier today was 7.1%. Diabetes is complicated by peripheral neuropathy, history of diabetic foot ulcer, and renal  insufficiency. He has been taking insulin for at least 3 years.   PAST MEDICAL HISTORY: 1.  Diabetes mellitus.  2.  Diabetic peripheral neuropathy.  3.  History of osteomyelitis, left foot status post debridement in 05/2012.  4.  Bipolar disorder.  5.  Coronary artery disease, status post PCI with stent placement.  6.  Nephrolithiasis.  7.  History of esophageal stricture, status post dilation.  8.  Hyperlipidemia.  9.  Gout.  10.  Neurogenic bladder with chronic indwelling Foley catheter.  11.  Renal insufficiency.  12.  Tremor.   PAST SURGICAL HISTORY: 1.  Cholecystectomy.  2.  I and D left foot, 05/2012.  3.  Kidney stone surgery x 2.  4.  Esophageal dilation.   CURRENT MEDICATIONS: 1.  NovoLog insulin sliding scale.  2.  Allopurinol 100 mg daily.  3.  Rocephin 1 gram daily.  4.  Depakote 1000 mg daily.  5.  Iron 325 mg t.i.d.  6.  Neurontin 100 mg t.i.d.  7.  Prednisone taper, currently at 50 mg daily.  8.  Propranolol 20 mg daily.  9.  Flomax 0.4 mg daily.  10.  Ziprasidone 80 mg b.i.d.  11.  Heparin 5000 units q.8 hours.   SOCIAL HISTORY: The patient resides at [facility] nursing facility. He has a daughter who lives locally. No tobacco or alcohol use.   FAMILY HISTORY: Positive for heart disease.   ALLERGIES: COLCHICINE.  REVIEW OF SYSTEMS: Unable to obtain.  PHYSICAL EXAMINATION: VITAL SIGNS: Height 67.9 inches, weight 123 pounds, BMI 18. Temperature 98.9, pulse 99, respirations 20, blood pressure 119/71, pulse oximetry 99% at rest on room air.  GENERAL: Thin white male in no acute distress.  HEENT: EOMI. Oropharynx is clear. Mucous membranes are dry.  NECK: Supple. No appreciable thyromegaly.  NEUROLOGIC: Tremor is present. The patient is alert, oriented only to self, not place or time, patient aware he is in hospital. SKIN: No rash or dermatopathy.  CARDIAC: Regular rate and rhythm without appreciable carotid bruit.  PULMONARY: Clear to auscultation  bilaterally. No wheeze.  ABDOMEN: Soft, nontender.  EXTREMITIES: No edema is present. Diffuse muscular atrophy is present.   LABORATORY DATA: Glucose 369, BUN 41, creatinine 1.56, potassium 5.5, calcium 8.2. Hemoglobin A1c 7.1. Hematocrit 30.5%, WBC 5, platelets 106. Urine culture shows greater than 100,000 CFU/mL gram-negative rod. Insulin level less than 0.3. C-peptide 0.3 at a time when blood sugar was low at 47.   ASSESSMENT: A 64 year old male with chronic diabetes complicated by polyneuropathy who presents with hypoglycemia, likely due to excessive insulin dosing. I suspect excessive insulin dosing because on the [facility], it seems he was given high doses of extra insulin ever since he was started on steroids to treat hyperglycemia. However, I expect this may have been too much for him. Of note, he has a history of type 2 diabetes and may be able to tolerate oral medications. Last year, I did have him on Januvia, however, never had followup with him to see if that was effective. It is known that he is fairly sensitive to insulin and does have a tendency to have hypoglycemia.   RECOMMENDATIONS: 1.  Will restart Levemir at 15 units at bedtime.  2.  Will give a mealtime NovoLog of 4 units t.i.d. before meals.  3.  Will give a modified sliding scale to start at a blood sugar greater than 200 of 2 units and add 2 units per every 50 over 250.  4.  Will add Januvia 50 mg, renally dosed, daily.   Thank you for the kind request for consultation. I will follow along with you. I am available over the weekend.   ____________________________ A. Wendall Mola, MD ams:jcm D: 11/09/2013 17:56:17 ET T: 11/09/2013 19:10:54 ET JOB#: [id number]  cc: A. Wendall Mola, MD, <Dictator> Macy Mis MD ELECTRONICALLY SIGNED 11/16/2013 16:04

## 2015-01-11 NOTE — Discharge Summary (Signed)
PATIENT NAME:  [name], CLOS MR#:  [id number] DATE OF BIRTH:  1951/04/24  DATE OF ADMISSION:  11/25/2013 DATE OF DISCHARGE:  11/27/2013  ADMISSION DIAGNOSIS: Systemic antiinflammatory response syndrome.  DISCHARGE DIAGNOSES: 1.  Encephalopathy from sepsis.  2.  Sepsis with fever at nursing home. 3.  Elevated lactic acid and hypotension in the Emergency Department.  5.  Left wrist gout versus cellulitis. 6.  Recent fungemia. 7.  Diabetes.  8.  Decubitus ulcer, stage II.   CONSULTATIONS: None.   PERTINENT LABORATORY AND DIAGNOSTICS: Blood culture and urine culture are negative to date.   CT of the head showed no acute intracranial hemorrhage.   Chest x-ray showed no active disease.   Discharge white blood cells 8.2, hemoglobin 8.2, hematocrit 26, and platelet are 207,000.   HOSPITAL COURSE: This is a 64 year old male who was brought in with a fever of 100.6 from the nursing home and for altered mental status. For further details, please refer to the H and P.  1.  Encephalopathy from sepsis, unclear source. The patient was also disimpacted in the ED which probably caused the etiology of his encephalopathy, his constipation. His CT of the head was negative. He is back to his baseline. 2.  Sepsis with fever at nursing home, elevated lactic acid, and hypotension. The patient did not have an etiology, was started on broad-spectrum antibiotics. He has what appears to be gout in the left wrist, but possible cellulitis as well so this could be the source. He was placed on antibiotics and will be discharged with Keflex. He has a chronic Foley for urinary retention, but urine culture here was negative.  3.  Left wrist cellulitis and gout. Did appear that the patient had gout flare as well as possible cellulitis. He was placed on antibiotics. X-ray of his left wrist was negative for fracture. Uric acid was normal. He was placed on steroids for his gout.  4.  Recent fungemia. He completed his course  of treatment on the 23rd of February.  5.  Diabetes. The patient will continue his outpatient medications including Levemir.  6.  Decubitus ulcer, stage II, which was present on admission.   DISCHARGE MEDICATIONS: 1.  Ativan 0.5 mg t.i.d.  2.  Quetiapine 300 mg at bedtime.  3.  Tamsulosin 0.4 mg daily.  4.  Gabapentin 100 mg t.i.d. with meals.  5.  Geodon 80 mg b.i.d.  6.  Allopurinol 100 mg daily.  7.  Propranolol 20 mg daily.  8.  Ferrous sulfate 325 t.i.d.  9.  Multivitamin 1 tablet daily.  10.  Depakote 500 mg 2 tablets daily.  11.  Sitagliptin 50 mg 1 tablet daily.  12.  Levemir 15 units at bedtime.  13.  Glucagon as needed for decreased blood sugar less than 60. 14.  Prednisone 50 mg x5 days.  15.  Acetaminophen 2 tablets q. 4 hours p.r.n. pain or temperature.  16.  Glucerna shake t.i.d.  17.  Protonix 40 mg daily.  18.  Keflex 500 mg t.i.d. for 5 days.  19.  Colace 100 mg b.i.d.  20.  The patient has a indwelling Foley catheter.   DISCHARGE DIET: Low sodium, ADA diet. Discharge supplement: Glucerna t.i.d.   DISCHARGE REFERRAL: Physical therapy.   DISCHARGE FOLLOWUP: The patient will follow up with Joen Laura in 1 week. Again, the patient does have a sacral decubitus ulcer. The patient will need to be turned every 2 hours while in bed and dressing care per the  facility.   TIME SPENT: 40 minutes. ____________________________ Janyth ContesSital P. Juliene PinaMody, MD spm:sb D: 11/27/2013 12:43:27 ET T: 11/27/2013 12:53:57 ET JOB#: [id number]  cc: Angeletta Goelz P. Juliene PinaMody, MD, <Dictator> Burley SaverL. Katherine Bliss, MD Janyth ContesSITAL P Lylian Sanagustin MD ELECTRONICALLY SIGNED 11/27/2013 20:47

## 2015-01-11 NOTE — H&P (Signed)
PATIENT NAME:  [name], [name] MR#:  [id number] DATE OF BIRTH:  March 31, 1951  DATE OF ADMISSION:  11/08/2013  PRIMARY CARE PROVIDER: Beverely Risen, MD EMERGENCY DEPARTMENT REFERRING PHYSICIAN: Sharman Cheek, MD  CHIEF COMPLAINT: Decrease in responsiveness, hypoglycemia.   HISTORY OF PRESENT ILLNESS: The patient is a 64 year old white male who has had recurrent admissions to the hospital over the past few months. Last admission was February 6 to February 13. At that time, he was admitted with systemic inflammatory response syndrome due to fungemia and hypoglycemia. He has a chronic indwelling Foley.   Apparently at the skilled nursing facility today, he was a little more confused than normal today and he pulled his Foley out and then subsequently fell on the side of the floor and hit his head. Therefore, he was sent to the ED.   When he arrived in the ED, blood sugars were in the 30s. He was noted to have a blood sugar drop of 34. He is currently confused and is not able to give me any history. He reports that the nursing staff pulled out the Foley. Otherwise he has not been noted to have fever at the facility.   PAST MEDICAL HISTORY:  1.  History of systemic inflammatory syndrome due to fungemia due to Candida glabrata.  2.  History of diabetes with recurrent episodes of hypoglycemia.  3.  Chronic indwelling Foley due to neurogenic bladder.  4.  History of hematuria.  5.  Chronic anemia.  6.  History of gout.  7.  History of diabetic neuropathy.  8.  History of urinary retention.  9.  Chronic kidney disease with a baseline creatinine of around 2. 10.  Restless leg syndrome.  11.  Bipolar disorder.  12.  Depression.  13.  Worsening dementia.  14.  History of essential tremors.   ALLERGIES: COLCHICINE.   PAST SURGICAL HISTORY: None.   MEDICATIONS: According to the facility medication reconciliation, he is on acetaminophen hydrocodone 325/5 mg one tab p.o. q.6 p.r.n., allopurinol 100  daily, divalproex acid 500 two tabs daily, iron sulfate 325 mg one tab p.o. t.i.d., gabapentin 100 mg one tab p.o. t.i.d., glucagon injectable as needed for hypoglycemia, Levemir 20 units subcu daily, loperamide 2 mg one tab p.o. q.4 hours p.r.n., lorazepam 0.5 mg one tab p.o. t.i.d., micafungin 100 mg IV daily. The finish date is February 23. Multivitamin daily, prednisone taper, propranolol 20 daily, quetiapine 300 mg daily, Flomax 0.4 daily, ziprasidone 80 mg one tab p.o. b.i.d.   SOCIAL HISTORY: The patient is wheelchair-bound at baseline. Cannot walk due to neuropathy as well as weakness. No history of smoking or alcohol abuse.   FAMILY HISTORY: History of heart disease runs in the family according to medical records.   REVIEW OF SYSTEMS: Unobtainable due to the patient's being confused.   PHYSICAL EXAMINATION:  VITAL SIGNS: Temperature 97.5, pulse 80 , respirations 18, blood pressure 133/87, O2 95%.  GENERAL: A chronically looking ill male, currently not in any acute distress.  HEENT: Head atraumatic, normocephalic. Pupils equally round, reactive to light and accommodation. There is no conjunctival pallor. No scleral icterus. Nasal exam shows no drainage or ulceration.  OROPHARYNX: Clear without any exudate.  NECK: Supple without any JVD. No thyromegaly. No adenopathy.  LUNGS: Clear to auscultation bilaterally without any rales, rhonchi, or wheezing.  CARDIOVASCULAR: S1, S2 positive. No murmurs, rubs, clicks, or gallops.  GASTROINTESTINAL: Abdomen soft, nontender, nondistended. Positive bowel sounds x4. RECTAL: Deferred.  EXTREMITIES: No clubbing, cyanosis, or edema.  SKIN: No rash.  LYMPHATICS: No lymph nodes palpable.   MUSCULOSKELETAL: No erythema or swelling.  SKIN: No rash.  LYMPHATICS:  NEUROLOGIC: No lymph nodes palpable. Cranial nerves II through XII grossly appear intact.  PSYCHIATRIC: Currently not anxious or depressed.   DIAGNOSTIC EVALUATIONS: CT scan of the head without  contrast shows stable atrophy. No acute intracranial abnormalities.   C-spine shows focal degenerative changes. No other abnormality.   Glucose 47, BUN 40 to 54, creatinine 1.69, sodium 141, potassium 5.1, chloride 108. CO2 is 29. Calcium 8.4, total protein 6.5, albumin of 2.7, bili total 0.4. Alkaline phosphatase is 128. AST 14, ALT 28. Troponin less than 0.02. WBC 11.6, hemoglobin 12.4, platelet count 125.   Urinalysis shows 3+ leukocytes. Nitrites negative. WBCs 223. Lactic acid 0.8.   ASSESSMENT AND PLAN: The patient is a 64 year old white male with recurrent admissions for sepsis and other issues who is sent back from the skilled nursing facility for confusion. Now noted to be hypoglycemic, noted to have a urinary tract infection.  1.  Urinary tract infection with history of multiple admissions. At this time, we will get urine cultures. I am going to place him on IV Rocephin. He likely has multidrug-resistant bacteria, but at this point, we do not want to continue to keep using broad-spectrum antibiotics in light of his history of fungemia and recurrent infections. Based on his cultures, we will tailor his antibiotics.  2.  Hypoglycemia, recurrent in nature. Will ask endocrine consult. I am going to check a C-peptide and an insulin level. We will follow his blood glucose. Place him on D10. I am also going to check a random cortisol.  3.  Recent fungemia. Continue micafungin, stop date February 23.  4.  Chronic anemia. Follow his hemoglobin.  5.  History of gout. He is on prednisone taper which we will continue.   OVERALL PROGNOSIS: Very poor with recurrent admissions.   NOTE: Time spent 50 minutes.    ____________________________ Lacie ScottsShreyang H. Allena KatzPatel, MD shp:np D: 11/08/2013 17:53:58 ET T: 11/08/2013 18:32:42 ET JOB#: [id number]  cc: Ceylin Dreibelbis H. Allena KatzPatel, MD, <Dictator> Charise CarwinSHREYANG H Jakyiah Briones MD ELECTRONICALLY SIGNED 11/09/2013 14:46

## 2015-01-11 NOTE — H&P (Signed)
PATIENT NAME:  [name], [name] MR#:  [id number] DATE OF BIRTH:  [birth year]  DATE OF ADMISSION:  10/19/2013  ADMITTING PHYSICIAN: Gladstone Lighter, MD  PRIMARY CARE PHYSICIAN: Clayborn Bigness, MD   CHIEF COMPLAINT: Sent from [facility] assisted living facility for hematuria.   HISTORY OF PRESENT ILLNESS: Mr. Liska is a 64 year old Caucasian male with past medical history significant for insulin-dependent diabetes and history of brittle diabetes with hypoglycemic episodes, diabetic neuropathy, urinary retention, and  neurogenic bladder status post chronic Foley catheter, CKD stage III, bipolar with depression, and recent admission for gout 2 days ago to the hospital and was just discharged on prednisone taper, was sent from assisted living facility for hematuria. The patient is a poor historian. He is not sure how long it has been going. During his past hospitalizations last year the patient was diagnosed to have neurogenic bladder and was having significant urinary retention for which he was discharged with chronic Foley. At that time, a CT of the abdomen revealed bladder wall thickening which required further evaluation. It was thought probably from chronic cystitis and urology was consulted and they asked the patient to follow up as an outpatient. The patient is not sure if he had any cystoscopy done. There was a concern at that time if that was chronic cystitis versus malignant changes in the bladder. Anyways, the patient is currently having significant hematuria. Hemoglobin dropped from 10 to 8, and he is on continuous bladder irrigation. So far his vitals are stable and he is being admitted for the same.   PAST MEDICAL HISTORY: 1.  Insulin-dependent diabetes mellitus/brittle diabetes.  2.  Diabetic neuropathy.  3.  Urinary retention.  4.  Neurogenic bladder status post chronic Foley catheter.  5.  Stage III CKD with baseline creatinine around 2.  6.  Restless leg syndrome.  7.  Bipolar with  depression.  8.  Thickening of bladder wall as noted on previous CT.  9.  Multiple urinary tract infections.  10.  Dementia.  11.  Gout with recent acute flare 2 days ago of the left wrist.  12.  Essential hand tremors.   PAST SURGICAL HISTORY: None.  ALLERGIES TO MEDICATIONS: COLCHICINE.   CURRENT MEDICATIONS: 1.  Ativan 0.5 mg p.o. t.i.d.  2.  Midodrine 10 mg p.o. t.i.d.  3.  NovoLog 70/30 34 units once a day subcutaneously.  4.  Seroquel 300 mg at bedtime.  5.  Flomax 0.4 mg p.o. daily.  6.  Depakote 125 mg sprinkles-8 capsules once a day.  7.  Gabapentin 100 mg p.o. t.i.d.  8.  Lantus 20 units subcutaneous once a day at bedtime.  9.  Geodon 80 mg p.o. b.i.d.  10.  Polyethylene glycol p.r.n. for constipation.  11.  Propranolol 10 mg p.o. daily.  12.  Allopurinol 100 mg p.o. daily.  13.  Augmentin 1 tablet p.o. b.i.d. for 2 more days.  14.  Prednisone taper started at discharge 2 days ago, on 10/16/2013.   SOCIAL HISTORY: Currently a resident of  [facility] assisted living facility, wheel-chair bound at baseline according to the patient and cannot walk secondary to his neuropathy and weakness. No history of any smoking or alcohol use currently.   FAMILY HISTORY: Heart disease runs in the family and parents with unknown cancer. Brother had pancreatic cancer.   REVIEW OF SYSTEMS: CONSTITUTIONAL: No fever, fatigue, or weakness.  EYES: Positive for blurred vision. Uses glasses. No glaucoma or cataracts.  ENT: No tinnitus, ear pain, hearing loss,  epistaxis, or discharge.  RESPIRATORY: No cough, wheeze, hemoptysis, or COPD. CARDIOVASCULAR: No chest pain, orthopnea, edema, arrhythmia, palpitations, or syncope.  GASTROINTESTINAL: Positive for nausea. No vomiting, diarrhea, abdominal pain. No hematemesis or melena.  GENITOURINARY: Positive for hematuria. Positive for urinary retention and chronic Foley catheter.  ENDOCRINE: No polyuria, nocturia, thyroid problems, heat or cold  intolerance.  HEMATOLOGY: Positive for anemia and bleeding. No easy bruising.  SKIN: No acne, rash, or lesions.  MUSCULOSKELETAL: Positive for gout and arthritis.  NEUROLOGIC: No numbness, weakness, CVA, TIA, or seizures. Positive for peripheral neuropathy. PSYCHIATRIC: History of bipolar depression, currently not anxious. No history of insomnia.   PHYSICAL EXAMINATION: VITAL SIGNS: Temperature 98 degrees Fahrenheit, pulse 79, respirations 18, blood pressure 129/79, and pulse ox 98% on room air.  GENERAL: Well-built, well-nourished male who has lost a lot of weight since last year, lying in bed, not in any acute distress.  HEENT: Normocephalic, atraumatic. Pupils equal, round, and reacting to light. Anicteric sclerae. Extraocular movements intact. Oropharynx: Edentulous. Otherwise, clear with no erythema, mass, or exudates.  NECK: Supple. No thyromegaly, JVD, or carotid bruits. No lymphadenopathy.  LUNGS: Moving air bilaterally. No wheeze or crackles. No use of accessory muscles for breathing.  CARDIOVASCULAR: S1 and S2 regular rate and rhythm. A 3/6 systolic murmur heard. No rubs or gallops.  ABDOMEN: Soft, nontender, nondistended. No hepatosplenomegaly. Normal bowel sounds.  EXTREMITIES: No pedal edema. No clubbing or cyanosis. 2+ dorsalis pedis pulses palpable bilaterally.  SKIN: No acne, rash, or lesions.  LYMPHATICS: No cervical or inguinal lymphadenopathy.  NEUROLOGIC: Cranial nerves are intact. The patient does have some dystonia around the mouth and some tremors noted of his extremities. Otherwise, cranial nerves are intact. Strength is 5/5 in both upper extremities and 4/5 in both lower extremities and sensation is decreased in glove stocking distribution. PSYCHOLOGIC: The patient is awake, alert, and oriented x2.  LABORATORY AND DIAGNOSTICS: WBC 15.3, hemoglobin 8.8, hematocrit 26.7, and platelet count is 302.   Sodium 137, potassium 4.3, chloride 106, bicarb 30, BUN 56, creatinine  1.8, glucose 142, and calcium 8.7.   ALT 16, AST 10, alk phos 79, total bili 0.2, and albumin 3.7. Urinalysis is only significant for hematuria, but more than 114,000 RBCs. No bacteria seen.   INR is 1.1.  EKG is showing normal sinus rhythm, heart rate of 78.   ASSESSMENT AND PLAN: A 64 year old male with past medical history significant for chronic kidney disease stage III, brittle diabetes mellitus, neurogenic bladder with chronic urinary retention and chronic Foley, hypertension, and dementia who is from [facility] assisted living facility, sent in for hematuria. Hemoglobin also dropped from baseline.  1.  Gross hematuria. Will order continuous bladder irrigation. Will need a CT of the pelvis without contrast to rule out any bladder masses. Will get urology consult and hemoglobin check every 8 hours.  2.  Acute on chronic anemia. Hemoglobin baseline around 9 to 10, now down to 8, secondary to hematuria. Hemoglobin check every 8 hours and transfuse if further significant drop. The patient is stable at this time and IV fluids have been ordered. 3.  Brittle diabetes mellitus with previous history of hypoglycemia. I am not sure why he is both on 70/30 and Lantus, although will hold off on the 70/30 at this time and continue Lantus for now and sliding scale insulin.  4.  Recent admission for gout of left wrist 2 days ago. Continue his steroid taper. Symptoms seem to be improving. Continue his allopurinol. 5.  Essential tremors. On propranolol, dose increased. 6.  Dementia with bipolar and depression. Continue his home medications including Geodon, Seroquel, and Depakote.   CODE STATUS: FULL code.   TIME SPENT ON ADMISSION: 50 minutes. ____________________________ Gladstone Lighter, MD rk:sb D: 10/19/2013 15:02:19 ET T: 10/19/2013 15:18:18 ET JOB#: [id number]  cc: Gladstone Lighter, MD, <Dictator> [name], MD Gladstone Lighter MD ELECTRONICALLY SIGNED 10/25/2013 14:58

## 2015-01-11 NOTE — H&P (Signed)
PATIENT NAME:  [name], [name] MR#:  [id number] DATE OF BIRTH:  09/19/1951  DATE OF ADMISSION:  11/25/2013  PRIMARY CARE PHYSICIAN: Dr. Beverely Risen.   REFERRING EMERGENCY ROOM MEDICAL DOCTOR: Dr. Dorothea Glassman.   CHIEF COMPLAINT: Altered mental status.   HISTORY OF PRESENT ILLNESS: The patient is a 64 year old male with a DO NOT RESUSCITATE CODE STATUS, was sent over from the nursing home to the hospital for fever and altered mental status. The patient is lethargic, but arousable.  He is nonverbal on arrival and even during my examination. He has indwelling Foley catheter. Temperature initially was at 100.7, pulse 80, respirations 18. Initial blood pressure was at 126/60 with pulse oximetry 95%. Subsequently, systolic blood pressure dropped down to 70s and with fluid boluses, again, it went up to 110 to 120 systolic. His initial Accu-Chek was 475, and the patient was given insulin, subsequently blood sugar trended down. The patient was disimpacted in the ER by the ER physician and fecal occult blood test was negative. CT head was negative. Chest x-ray was also negative. The patient's lactic acid is elevated at 1.9. As he is with altered mental status, low-grade fever, elevated lactic acid and hypotensive, blood cultures were obtained and broad-spectrum antibiotics were started in the ER. No family members are at bedside and unable to get any history from the patient. 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   PAST MEDICAL HISTORY: History of systemic inflammatory response syndrome due to fungemia, due to candida glabrata. History of diabetes with recurrent episodes of hypoglycemia.  Chronic indwelling Foley catheter due to neurogenic bladder, hematuria, anemia, gout, diabetic neuropathy, urinary retention, chronic renal insufficiency with baseline creatinine of around 2, restless leg syndrome, bipolar disorder and depression, worsening of  dementia, essential tremors.   PAST SURGICAL HISTORY: None.   ALLERGIES: COLCHICINE.   PSYCHOSOCIAL HISTORY: Currently residing at [facility] According  to the old records, no history of smoking, alcohol or illicit drug usage.   FAMILY HISTORY: Heart disease runs in his family from old records.   REVIEW OF SYSTEMS: Unobtainable the patient is with altered mental status.   PHYSICAL EXAMINATION: VITAL SIGNS: Temperature 99.1, pulse 86, respiration 16, blood pressure 105/66, pulse oximetry is 97% on room air, at the time of arrival. Subsequently, at around 4:30 a.m. his blood pressure dropped down to 75/48, but the patient was not tachycardic. The patient was given 2 liters of fluid boluses and at around 5:30 a.m. blood pressure went up to 113/61, pulse was at 84. GENERAL APPEARANCE: Not in acute distress. The patient is very lethargic, but opens his eyes to verbal commands. I was unable to get any history from the patient. Moderately built and thin-looking emaciated Caucasian male.  HEENT: Normocephalic. Has bruises on right upper eyelid and bruises were noticed in the extremities. The patient has indwelling Foley catheter. Pupils are sluggishly reacting to light and accommodation, dry mucous membranes.  NECK: Supple. No JVD. No thyromegaly.  LUNGS: Clear to auscultation bilaterally. No accessory muscle usage. No chest wall tenderness on palpation.  CARDIAC: S1 and S2 normal. Regular rate and rhythm. No murmurs.  GASTROINTESTINAL: Soft. Bowel sounds are positive in all four quadrants. Nontender, nondistended. No masses felt. NEUROLOGICAL: Lethargic  but opens his eyes to his name. Not following any other verbal commands. Reflexes are 2+.  SKIN: Warm to touch. Dry in nature. Decreased turgor. No rashes.  EXTREMITIES: Right lower leg with  wound covered with a clean bandage, stage 1 to 2 sacral decubitus ulcer.  LABORATORIES AND IMAGING STUDIES:  Urinalysis: Glucosuria with glucose  greater than 500, ketones are negative, specific gravity 1.015, nitrites and leukocyte esterase are negative. ABG: pH 7.41, pCO2 43, lactic acid 1.9. CBC: WBC is normal. Hemoglobin is at 8.2, hematocrit 25.6, platelets 207, troponin less than 0.02. TSH is normal. LFTs: Total protein is low at 5.9, albumin 2.5. The rest of the LFTs are normal. Chem-8: Glucose 411. BUN 30, creatinine 1.77. Sodium, potassium and chloride are normal. GFR 40, anion gap 6, calcium 7.7, serum osmolality is 299. A 12-lead EKG: Normal sinus rhythm, age undetermined septal infarct were noticed. No acute ST-T wave changes.   CT head is normal. Chest x-ray, no active disease.   ASSESSMENT AND PLAN: This 64 year old Caucasian male sent over from the nursing home for altered mental status, fever and hypotension in the ER status post   impaction, stool for Hemoccult was negative in the ER.  1. Altered mental status, probably from systemic inflammatory response syndrome/metabolic encephalopathy. The patient being with lethargic and with altered mental status. We will keep him n.p.o. for now. We will provide IV fluids. Blood cultures were obtained. The patient is started on broad-spectrum antibiotics in the ER. We will continue Zosyn and Levaquin for broad-spectrum coverage. Follow up with culture results.  2. Chronic indwelling catheter. We will consider changing the catheter.  3. Recent history of systemic inflammatory response syndrome with fungemia.  4. Diabetes mellitus. The patient will be on sliding scale insulin.  5. History of worsening depression. If necessary, we will consider psych consult.  6. Restless leg syndrome.  7. He is DO NOT RESUSCITATE. We will provide him gastrointestinal prophylaxis with heparin subcutaneous and deep vein thrombosis prophylaxis. No family members are available to discuss the patient's condition. We will try to reach the family members in the a.m.   TOTAL TIME SPENT ON ADMISSION: Is 50 minutes.    ____________________________ Ramonita LabAruna Emitt Maglione, MD ag:sg D: 11/25/2013 06:50:29 ET T: 11/25/2013 10:19:17 ET JOB#: [id number]  cc: Ramonita LabAruna Altus Zaino, MD, <Dictator> Lyndon CodeFozia M. Khan, MD  Ramonita LabARUNA Gretta Samons MD ELECTRONICALLY SIGNED 12/07/2013 7:54

## 2015-01-11 NOTE — Consult Note (Signed)
PATIENT NAME:  [name], [name] MR#:  [id number] DATE OF BIRTH:  [birth year]  DATE OF CONSULTATION:  10/29/2013  REFERRING PHYSICIAN:  Hope PigeonVaibhavkumar G. Elisabeth PigeonVachhani, MD CONSULTING PHYSICIAN:  Stann Mainlandavid P. Sampson GoonFitzgerald, MD  REASON FOR CONSULTATION: Sepsis and candidemia.   HISTORY OF PRESENT ILLNESS: This is a pleasant 64 year old with dementia as well as  chronic Foley catheter who has had 3 admissions in the last month. His first one was with gout, treated with steroids. The second one was with bleeding in his Foley catheter and the third one was with fevers and sepsis. Most recently the patient was admitted from a nursing home February 6th, at which point he was hypotensive and febrile. Temperature was 103. The patient has had his Foley catheter changed recently at his facility. Blood cultures drawn on that day of admission are now growing 2 out of 2 bottles of yeast. This was on February 6th. Stool cultures and C. diff have been negative. The patient's sepsis has been treated with IV antibiotics and he has clinically responded. He has currently been started on micafungin.   History is obtained mainly from the charts. The patient is able to converse but is a poor historian. Currently, he denies any pain, chest pain, cough, shortness of breath, problems with his catheter or diarrhea. He does have a central line in his right IJ but that was placed this admission.   PAST MEDICAL HISTORY: Diabetes, chronic Foley catheter, anemia, dementia, gout, diabetic neuropathy, chronic kidney disease, restless leg syndrome, bipolar depression, recurrent urinary tract infections.   PAST SURGICAL HISTORY: None.   ALLERGIES: COLCHICINE.   SOCIAL HISTORY: [facility]. He is wheelchair bound due to his neuropathy. He has a chronic Foley. No tobacco or alcohol. He is a retired Optician, dispensingminister.   FAMILY HISTORY: Positive for heart disease and pancreatic cancer.   REVIEW OF SYSTEMS: Unable  to obtain due to the patient's dementia.   CURRENT ANTIBIOTICS SINCE ADMISSION: Include micafungin begun on February 7th, Zosyn begun February 5th through the 8th, vancomycin begun February 5th through the 7th.     PHYSICAL EXAMINATION: VITAL SIGNS: T-max is 98.7, the patient has been afebrile since his admission, when his temp was reportedly 103. Pulse is 85, blood pressure 180/88, sat 97%.  GENERAL: He is thin, lying in bed in no acute distress. Moderate dementia.  HEENT: Pupils equal, round and reactive to light and accommodation. Oropharynx clear.  NECK: Supple. He has a right IJ in place with no tenderness or pain.  HEART: Regular with no murmurs.  LUNGS: Clear to auscultation bilaterally.  ABDOMEN: Soft, nontender, nondistended.  GENITOURINARY: He has a Foley catheter in which shows no noted tenderness or drainage.  EXTREMITIES: No clubbing, cyanosis or edema.  SKIN: He has no peripheral stigmata of endocarditis.  NEUROLOGIC: He has moderate dementia.  He was able to move all 4 extremities.   LABORATORY, DIAGNOSTIC AND RADIOLOGICAL DATA:  Blood cultures done on admission, February 6th grew 2 out of 2 yeast.  Influenza test February 6th negative. Stool culture negative. C. diff negative. Urine culture February 6th is no growth to date. Urinalysis done on admission showed he had 0 to 5 whites. Of note, he grew Candida parapsilosis in a prior urine culture. White count is currently 13.4, down from 33 on admission; hemoglobin is 6.3, platelet count 161. Renal function shows a creatinine of 1.25, BUN 25. Imaging: Chest x-ray done February 7th shows no acute abnormality. Chest x-ray  on admission showed some hypoinflation.   IMPRESSION: A 64 year old chronic assisted living facility patient with dementia, diabetes, neurogenic bladder with chronic Foley, admitted with sepsis and growing yeast from 2 of 2 blood cultures. It is yet to be identified. He is currently on micafungin and his white count  has improved and he is no longer febrile or septic.   He does have a right internal jugular in but this was apparently placed on admission. He has had 2 recent admissions as well. I suspect he had had a line in those admissions but I cannot confirm that. He likely either has candidemia from a recent line or from a bladder source. CT scan of the abdomen done January 30th does show diffuse thickening of the urinary bladder wall and some high-density material within the bladder, probably due to the blood products. There was also nonobstructive bilateral nephrolithiasis but no hydroureter or hydronephrosis.   RECOMMENDATIONS: 1.  Continue micafungin.  2.  Repeat blood cultures x 2.  3.  Check echocardiogram to evaluate for any evidence of endocarditis although clinically he does not have any evidence of this.  4.  I would await final identification of the yeast. He could possibly be transitioned to oral fluconazole.  5.  It would be safe to keep him off of antibiotics at this point except the micafungin.  6.  I would suggest further workup of his bladder with possible cystoscopy.   Thank you for the consult. I will be glad to follow with you.    ____________________________ Stann Mainland. Sampson Goon, MD dpf:cs D: 10/29/2013 16:12:05 ET T: 10/29/2013 18:46:44 ET JOB#: [id number]  cc: Stann Mainland. Sampson Goon, MD, <Dictator> Tasfia Vasseur Sampson Goon MD ELECTRONICALLY SIGNED 10/31/2013 20:49

## 2015-01-11 NOTE — Discharge Summary (Signed)
PATIENT NAMEKELVEN, [name] MR#:  [id number] DATE OF BIRTH:  [birth year]  DATE OF ADMISSION:  10/19/2013 DATE OF DISCHARGE:  10/23/2013  DISCHARGE DIAGNOSES: 1.  Chronic Foley catheter due to bladder issue. Admitted with hematuria, stopped now. Need t follow up with urology clinic.  2.  Acute on chronic anemia due to hematuria. Hemoglobin stable, 7.8 on discharge. Did not need transfusion in this hospital.  3.  Diabetes.  4.  Generalized weakness due to anemia.  5.  Dementia.  6.  Recent gout attack, on tapering dose of steroid.   MEDICATIONS ON DISCHARGE:  1.  Lorazepam 0.5 mg oral tablet 3 times a day.  2.  Midodrine 10 mg oral tablet 3 times a day.  3.  Quetiapine 300 mg oral tablet once a day.  4.  Tamsulosin 0.4 mg oral capsule once a day.  5.  Gabapentin 100 mg oral capsule 3 times a day.  6.   Ziprasidone 80 mg oral capsule 2 times a day.  7.  Allopurinol 100 mg once a day.  8.  Loperamide 2 mg oral tablet every 4 hours as needed.  9.  Prednisone 10 mg start at 60 and taper by 5 mg daily until complete.  10.  Acetaminophen/hydrocodone 325 mg/5 mg oral tablet every 6 hours as needed for pain.  11.  Insulin sliding scale.  12.  Insulin Levemir 25 units once a day.  13.  Propranolol 20 mg oral tablet once a day.  14.  Ferrous sulfate 325 mg 3 times a day.  15.  Ensure 2 times a day.   DIET ON DISCHARGE: Carbohydrate-controlled ADA diet. Advised to have Ensure Plus twice daily. Regular consistency diet.   ACTIVITY: As tolerated.   TIMEFRAME TO FOLLOW-UP: Within 1 to 2 weeks for cystoscopy and check CBC with urologic clinic.   HISTORY OF PRESENT ILLNESS: A 64 year old Caucasian male with past medical history for insulin-dependent diabetes and neurogenic bladder status post chronic Foley catheter, CKD stage III, bipolar disorder, depression, and recent hospital admission for gout 2 days ago. Was in the hospital and discharged on prednisone tapering. Was sent to an assisted living  facility and he was sent back within 2 days because of now new complaint of hematuria. He had a chronic Foley catheter for some issues and was advised to follow with urologist in the past for cystoscopy, but he did not follow. Has neurogenic bladder and chronic urine retention because of that with bladder thickening. This time with hematuria his hemoglobin dropped from 10 to 8 within 2 days after last discharge and so he was started and continued on continuous bladder irrigation.   HOSPITAL COURSE AND STAY:  1.  On bladder irrigation, within the next 2 days his urine started clearing up. Hemoglobin remained stable. It was at 8 on discharge. Urologic consult was called in and they agreed with this plan and they advised for him to follow in the urology clinic in the next 2 weeks to have cystoscopy because of thickening of the bladder on CT scan as found in the past. He did not require any transfusion in this hospital course. 2.  Diabetes. He was taking Lantus and sliding scale coverage. We continued the same and remained stable.  3.  Admission with gout recently. He was not discharged on tapering oral steroids so we continued steroids in the hospital and discharged with the same.  4.  Essential tremor. He was on propranolol, continued the same.  5.  Dementia with bipolar and depression. We continued home medication, Geodon, Seroquel, and Depakote, and he remained stable.   CONSULTS IN THE HOSPITAL: Urologic  IMPORTANT LABORATORY RESULTS: Creatinine was 1.8 on admission and potassium was 4.3. Hemoglobin was 8.8. Urinalysis was negative with culture. Hemoglobin dropped to 8.1 and finally 7.5. Creatine remained stable at 1.3. Hemoglobin on discharge was 7.8, on 10/23/2013.  TOTAL TIME SPENT ON THIS DISCHARGE: 45 minutes. ____________________________ Hope PigeonVaibhavkumar G. Elisabeth PigeonVachhani, MD vgv:sb D: 10/26/2013 11:44:04 ET T: 10/26/2013 11:59:56 ET JOB#: [id number]  cc: Hope PigeonVaibhavkumar G. Elisabeth PigeonVachhani, MD,  <Dictator> Sheikh A. Ellsworth Lennoxejan-Sie, MD Suszanne ConnersMichael R. Evelene CroonWolff, MD Lyndon CodeFozia M. Khan, MD Heath GoldVAIBHAVKUMAR [facility] LLCVACHHANI MD ELECTRONICALLY SIGNED 11/10/2013 8:37

## 2015-01-11 NOTE — Consult Note (Signed)
Chief Complaint:  Subjective/Chief Complaint Urology F/U CBI Day #3  Pt without complaints. Denies any pain or discomfort.   VITAL SIGNS/ANCILLARY NOTES: **Vital Signs.:   01-Feb-15 04:52  Vital Signs Type Routine  Temperature Temperature (F) 97.4  Celsius 36.3  Temperature Source oral  Pulse Pulse 67  Respirations Respirations 18  Systolic BP Systolic BP 762  Diastolic BP (mmHg) Diastolic BP (mmHg) 71  Mean BP 87  Pulse Ox % Pulse Ox % 98  Pulse Ox Activity Level  At rest  Oxygen Delivery Room Air/ 21 %   Brief Assessment:  GEN well developed, no acute distress, thin   Respiratory normal resp effort   Additional Physical Exam Foley in place draining clear urine on slow CBI   Lab Results: Routine Micro:  30-Jan-15 10:00   Micro Text Report URINE CULTURE   COMMENT                   NO GROWTH IN 18-24 HOURS   ANTIBIOTIC                       Routine Chem:  01-Feb-15 05:12   Result Comment CBC - SMEAR SCANNED  Result(s) reported on 21 Oct 2013 at 08:14AM.  BUN  37  Creatinine (comp)  1.33  Sodium, Serum 138  Potassium, Serum 4.7  Chloride, Serum 107  CO2, Serum 26  Calcium (Total), Serum  8.0  Anion Gap  5  Osmolality (calc) 293  eGFR (African American) >60  eGFR (Non-African American)  57 (eGFR values <32m/min/1.73 m2 may be an indication of chronic kidney disease (CKD). Calculated eGFR is useful in patients with stable renal function. The eGFR calculation will not be reliable in acutely ill patients when serum creatinine is changing rapidly. It is not useful in  patients on dialysis. The eGFR calculation may not be applicable to patients at the low and high extremes of body sizes, pregnant women, and vegetarians.)  Routine Hem:  01-Feb-15 05:12   WBC (CBC)  11.8  RBC (CBC)  2.36  Hemoglobin (CBC)  7.5  Hematocrit (CBC)  22.1  Platelet Count (CBC) 254  MCV 94  MCH 31.9  MCHC 34.0  RDW  14.9  Neutrophil % 80.2  Lymphocyte % 15.0  Monocyte % 4.6   Eosinophil % 0.0  Basophil % 0.2  Neutrophil #  9.5  Lymphocyte # 1.8  Monocyte # 0.5  Eosinophil # 0.0  Basophil # 0.0   Assessment/Plan:  Invasive Device Daily Assessment of Necessity:  Does the patient currently have any of the following indwelling devices? foley   Indwelling Urinary Catheter continued, requirement due to   Reason to continue Indwelling Urinary Catheter bladder irrigation is required  long term catheterization has already been initiated   Assessment/Plan:  Assessment 1. Gross Hematuria - resolving with CBI - absolutely clear today on slow CBI (required moderate CBI yesterday to remain clear)  2. Anemia - remains hemodynamically stable with continued slow decrease in the Hct, although gross hematuria improving (see #1)  3. CKD - Cr continues to improve (?hyperfiltration with IV hydration)  4. Chronic Urnary Retention - unclear etiology, although, likely due to IDDM (also has diabetic neuropathy, CKD)  Continues without indication for Urologic Intervention   Plan 1. Continue NS CBI - if remains clear without need to increase the rate, consider clamping CBI tomorrow.  2. Agree with serial Hgb/Hct with transfusions as needed  3. Cystoscopy as an outpatient.  Dr.  Maryan Puls will be assuming urologic care as of 5am tomorrow.   Electronic Signatures: Darcella Cheshire (MD)  (Signed 01-Feb-15 13:38)  Authored: Chief Complaint, VITAL SIGNS/ANCILLARY NOTES, Brief Assessment, Lab Results, Assessment/Plan   Last Updated: 01-Feb-15 13:38 by Darcella Cheshire (MD)

## 2015-01-11 NOTE — H&P (Signed)
PATIENT NAME:  [name], [name] MR#:  [id number] DATE OF BIRTH:  07/26/1951  DATE OF ADMISSION:  10/14/2013  PRIMARY CARE PHYSICIAN:  Dr. Beverely RisenFozia Khan.   REFERRING PHYSICIAN:  Dr. Cyril LoosenKinner.   CHIEF COMPLAINT:  Fall and left wrist pain.   HISTORY OF PRESENT ILLNESS:  The patient is a 64 year old Caucasian male with past medical history of restless leg syndrome, diabetes mellitus, hypertension, neurogenic bladder and a history of renal stones is brought into the ER from nursing home by his daughter.  The patient is lethargic and unable to give a good history.  The patient being poor historian, is found to be mentally altered.  History is obtained from the old records and from the ER staff.  Daughter is not at bedside during my examination.  The patient is complaining of left wrist pain.  The patient sustained a fall approximately three days ago and since then he was complaining of pain and swelling in the neck wrist.  Left wrist x-ray has revealed no acute fractures.  The left wrist synovial fluid was tapped by the ER physician which has revealed monosodium urate crystals.  The patient's baseline creatinine is at 1.29, but it is elevated at 2.06 today.  Urinalysis is positive for UTI.  The patient has received IV Rocephin and vancomycin.  Morphine was given for pain management and hospitalist team is called to admit the patient.   PAST MEDICAL HISTORY:  Restless leg syndrome, diabetes mellitus, hypertension, neurogenic bladder, history of renal stones, dementia, metabolic encephalopathy, history of gout, bipolar disorder.   PAST SURGICAL HISTORY:  None.   ALLERGIES:  COLCHICINE.   HOME MEDICATIONS:  Tamsulosin 0.4 mg once daily, Ziprasidone 80 mg 1 capsule by mouth 2 times a day, omeprazole 20 mg by mouth once daily, midodrine 10 mg by mouth 3 times a day, lorazepam 0.5 mg 3 times a day, Lantus 20 units subcutaneously once daily at bedtime, gabapentin 100 mg 3 times a day, ceftriaxone 1 gram of intramuscular  injection once daily, divalproex sodium 1000 mg by mouth once daily.   PSYCHOSOCIAL HISTORY:  The patient is residing at nursing home.  No history of smoking, alcohol or illicit drug usage.   FAMILY HISTORY:  According to the old records mother and father died of complications of malignancy and also had a heart attack.  Brother had pancreatic cancer.   REVIEW OF SYSTEMS:  Unobtainable as the patient is a poor historian, demented and with altered mental status.   PHYSICAL EXAMINATION: VITAL SIGNS:  Temperature 99.1, pulse 90, respirations 18, blood pressure 142/72, pulse ox is 96%.  GENERAL APPEARANCE:  Not under acute distress.  Moderately built and thin-looking male with tremors.  HEENT:  Normocephalic, atraumatic.  Pupils are equal, reacting to light and accommodation.  No scleral icterus.  No conjunctival injection.  Dry mucous membranes.  No sinus tenderness.  Nasal not congested.  NECK:  Supple.  No JVD.  No thyromegaly.  LUNGS:  Clear to auscultation bilaterally.  No accessory muscle usage.  No anterior chest wall tenderness on palpation.  CARDIAC:  S1, S2 normal.  Regular rate and rhythm.  GASTROINTESTINAL:  Soft.  Bowel sounds are positive in all four quadrants.  Nontender, nondistended.  No hepatosplenomegaly.  No masses felt.  NEUROLOGIC:  Awake and alert, but oriented x 1.  The patient has a tremor.  Sensory is intact.  The patient is having a hard time following verbal commands.  Motor could not be completely elicited.  SKIN:  Warm to touch.  Skin is with normal turgor.  No rashes.  No lesions.  MUSCULOSKELETAL:  Left wrist is edematous, erythematous, tender to touch, status post synovial fluid tap with no leakage of fluid.  The rest of the joints are intact.  PSYCHIATRIC:  Mood and affect could not be elicited.   LABORATORY AND IMAGING STUDIES:  Synovial fluid analysis, color is red, lymphocytes 8, neutrophils 86, nucleated cell count 45,015, monosodium urate crystals are present.   Urinalysis yellow in color, clear in appearance, glucose is 50 mg/dL, nitrites are negative, leukocyte esterase are 2+.  WBC 12.3, hemoglobin is 9.8, hematocrit 29.7, platelets 240.  LFTs:  Albumin is at 2.7, total protein is 8.4.  Glucose 263,  creatinine 2.06, his baseline is at 1.25, sodium 135, potassium 4.7, chloride 102, CO2 30, GFR 34, anion gap 3.  Serum osmolality 290, calcium 8.4.  Left wrist x-ray, no acute fractures, negative.  Chest x-ray PA and lateral views:  No active cardiopulmonary disease.   ASSESSMENT AND PLAN:  A 64 year old Caucasian male sent over to the ER as he is complaining of severe left wrist pain after he sustained a fall 3 days ago will be admitted with the following assessment and plan.  1.  Acute kidney injury on chronic renal insufficiency.  We will provide the patient hydration with IV fluids.  Avoid nephrotoxins.  Repeat a.m. labs.  2.  Acute cystitis.  Urine culture is ordered.  IV Rocephin will be given.  3.  Altered mental status, probably from acute kidney injury and acute cystitis.  4.  Acute left wrist edema, probably from acute gout.  IV steroids are given.  We will provide him morphine IV as needed basis for pain.  Indocin will be initiated after acute kidney injury is improved.  5.  Diabetes mellitus.  Resume his home medication and the patient will be on sliding scale insulin.  6.  Hypertension.  Blood pressure is stable.  Resume home medications.  7.  We will provide gastrointestinal and deep vein thrombosis prophylaxis.    Total time spent on the admission is 50 minutes.     ____________________________ Ramonita Lab, MD ag:ea D: 10/14/2013 01:09:27 ET T: 10/14/2013 02:48:05 ET JOB#: [id number]  cc: Ramonita Lab, MD, <Dictator> Lyndon Code, MD Silas Flood. Ellsworth Lennox, MD Ramonita Lab MD ELECTRONICALLY SIGNED 10/14/2013 7:35

## 2015-01-11 NOTE — Op Note (Signed)
PATIENT NAMLayne [name]:  Jost, Fintan L MR#:  [id number] DATE OF BIRTH:  04-Sep-1951  DATE OF PROCEDURE:  10/26/2013  PREOPERATIVE DIAGNOSES: 1.  Sepsis.  2.  Hypotension.  3.  Respiratory failure.   POSTOPERATIVE DIAGNOSES: 1.  Sepsis.  2.  Hypotension.  3.  Respiratory failure.   PROCEDURE PERFORMED: Insertion of right IJ triple-lumen catheter with ultrasound guidance.   SURGEON: Renford DillsGregory G. Neko Mcgeehan, M.D.   DESCRIPTION OF PROCEDURE: The patient is in the Intensive Care Unit. He is critically ill. He is positioned supine, and his right neck is prepped and draped in sterile fashion. Ultrasound is placed in a sterile sleeve. Jugular vein is identified. It is echolucent and compressible, indicating patency. Image is recorded for the permanent record. Under real-time visualization, Seldinger needle is inserted after 1% lidocaine has been infiltrated in the soft tissues. J-wire is advanced. Counterincision is created. Dilator is passed over the wire, and the triple-lumen catheter is fed easily. All 3 lumens aspirate and flush easily. The catheter is secured to the skin of the neck with 2-0 silk, and a sterile dressing including a Biopatch is applied. The patient tolerated the procedure well.    ____________________________ Renford DillsGregory G. Joel Mericle, MD ggs:jcm D: 10/30/2013 14:42:37 ET T: 10/30/2013 16:32:54 ET JOB#: [id number]  cc: Renford DillsGregory G. Cellie Dardis, MD, <Dictator> Renford DillsGREGORY G Brennon Otterness MD ELECTRONICALLY SIGNED 11/01/2013 17:17

## 2015-01-11 NOTE — Discharge Summary (Signed)
[name] NAMEDEWIE, [name] MR#:  [id number] DATE OF BIRTH:  1951-09-04  DATE OF ADMISSION:  11/08/2013 DATE OF DISCHARGE:  11/11/2013  DISCHARGE DIAGNOSES: 1.  Hypoglycemia and encephalopathy, due to that decreased dose of insulin on discharge.  2.  Urinary tract infection and Morganella, sensitive to ceftriaxone. 3.  Recent fungemia last admission, was on micafungin to be given until 23rd of February via PICC line.  4.  Diabetes.  5.  Gout, on tapering prednisone.  6.  Chronic Foley, hematuria recent and past admission and need urologic clinic follow-up.  7.  Dementia.   CONDITION ON DISCHARGE:  Fair.   CODE STATUS:  DO NOT RESUSCITATE.  MEDICATIONS ON DISCHARGE:   1.  Lorazepam 0.5 mg oral tablet 3 times a day as needed for anxiety.  2.  Quetiapine 300 mg oral tablet once a day.  3.  Tamsulosin 0.4 mg oral capsule once a day.  4.  Gabapentin 100 mg oral capsule 3 times a day.  5.  Ziprasidone 80 mg 2 times a day.  6.  Allopurinol 100 mg oral tablet once a day.  7.  Loperamide 2 mg oral tablet every four hours as needed for diarrhea.  8.  Acetaminophen and hydrocodone tablet oral every six hours as needed for pain.  9.  Propranolol 20 mg oral tablet once a day.  10.  Ferrous sulfate 325 mg oral tablet 3 times a day.  11.  Divalproex 500 mg oral delayed-release tablet 2 tablets oral once a day.  12.  Prednisone 10 mg oral tablet once a day for three days.  13.  Insulin 15 units subcutaneous once a day in the morning after checking blood sugar level.  14.  Micafungin 100 mg intravenous powder for injection, 100 mg intravenous once a day at bedtime.  15.  Glucagon 1 mg injection as needed for decreased blood sugar level.  16.  Sitagliptin 50 mg oral tablet once a day.  17.  Ceftriaxone 1 gram intravenous every 24 hours for four days.    Advised to have PICC line and Foley catheter care as routine on discharge.   DIET:  Carbohydrate-controlled ADA diet, regular consistency, as  tolerated.   FOLLOW-UP:  Advised to follow in urology clinic in 1 to 2 weeks.    Also advised to check blood sugar level twice daily without any insulin coverage, keep a record of blood sugar at nursing home and speak to nursing home doctor.  If any reading less 70 or more than 350, then may need to adjust the dose of the insulin.   HISTORY OF PRESENT ILLNESS:  A 64 year old male with recurrent admissions recently to hospital, was last discharged on 13 of February after being admitted for SIRS due to fungemia and hypoglycemia.  He had a chronic indwelling Foley catheter, was discharged to nursing home with micafungin therapy to be finished at nursing home.  Now he was found to be confused and he tried to pull out his Foley and fell down on the floor.  In the Emergency Room his blood sugar was noticed to be 34 and so he was admitted for further management of that.  HOSPITAL COURSE AND STAY:  1.  Metabolic encephalopathy.  On presentation, it was due to UTI and hypoglycemia.  He was initially started on dextrose IV drip to maintain his blood sugar level, but later on sugar level was under control and so that was discontinued and we decreased the dose of insulin he  was getting every day.  The main reason for that suspected was as the [name] was discharged with steroids in the past because of gout and as a result of steroid, blood sugar was remaining high so insulin dose was higher for him and later on when he was tapering the steroids, maybe his sugar level went too low because of high dose of insulin.  Ultimately, in the hospital we decreased dose of insulin, his sugar level remained under control.  2.  For UTI, he was on IV Rocephin, was found having Morganella in urine which was sensitive to Rocephin and so we discharged with 4 more days to finish total of 7 days of Rocephin IV.   3.  Recent fungemia.  During the last admission he was found having fungemia and was discharged with micafungin injection at  nursing home and so we continued that in the hospital and on discharge we advised to continue the same.  4.  Chronic anemia, remained stable.  5.  Hyperkalemia.  Kayexalate was given and after that it was stable.  6.  History of hematuria.  He was supposed to follow in urology clinic in the next two weeks.  Advised to do the same after this discharge.   CONSULTATIONS IN THE HOSPITAL:  Endocrinology consult with Dr. Carlena SaxAnna Solum.   LABORATORY DATA:  WBC count on presentation 11.6, hemoglobin was 12.4, platelet count 125, BUN 54, creatinine 1.69, sodium 141, potassium 5.1.  Chest x-ray, portable, single view done which showed no pleural effusion or pneumothorax.  Blood culture did not grow anything.  Urinalysis was positive with 223 WBCs and urine culture grew more than 100,000 colony-forming units of Morganella which were sensitive to ceftriaxone.  Blood glucose was 34 on admission.   Total time spent on this discharge 40 minutes.    ____________________________ Hope PigeonVaibhavkumar G. Elisabeth PigeonVachhani, MD vgv:ea D: 11/15/2013 16:25:31 ET T: 11/16/2013 03:37:09 ET JOB#: [id number]  cc: Hope PigeonVaibhavkumar G. Elisabeth PigeonVachhani, MD, <Dictator> Burley SaverL. Katherine Bliss, MD Altamese DillingVAIBHAVKUMAR Daveyon Kitchings MD ELECTRONICALLY SIGNED 11/18/2013 0:53

## 2015-01-11 NOTE — Consult Note (Signed)
PATIENT NAME:  [name], [name] MR#:  [id number] DATE OF BIRTH:  1951/02/09  AGE:  64 years  SEX:  Male  RACE:  White  DATE OF DICTATION: 10/27/2013  PLACE OF DICTATION:  [facility], Room #211, [place]  DATE OF CONSULTATION:  10/27/2013  CONSULTING PHYSICIAN:  Jayten Gabbard K. Zilla Shartzer, MD  SUBJECTIVE:  The patient was seen in consultation in room #211. The patient is a 64 year old white male who is not employed and retired as a Optician, dispensingminister. The patient is divorced for many years after being married for a few years. The patient reports that currently he has his 2 children living with him. One is 63 years old, one is 63 years old, and they help take care of him. The patient has a long history of mental illness with depression, and has been on medications for the same. The patient reports that he is being followed by a mental health clinic in [place]. Does not remember the name of the agency or name of the doctor.   PAST PSYCHIATRIC HISTORY:  The patient reports that he is being followed in mental health for many years. Denies any history of suicide attempts, alcohol and drugs denied.  CHIEF COMPLAINT: "I had a fall, I am physically sick, and need help."  HISTORY OF PRESENT ILLNESS: According to information obtained from the chart, the patient was admitted for sepsis, with nausea, vomiting and diarrhea, and was transferred from CCU.   OBJECTIVE:  The patient was seen lying in bed. He appears a lot older than stated age. Alert, he knew he was at [facility] in [place], [place], where he stated "This is January 2015" and did not know the exact date. This is wrong, as the month is February. He knew the [place]. He knew the [address] [place] but he could not name the current president. Affect is appropriate with his mood, which is low and down, but today he denies feeling depressed. Denies feeling hopeless or helpless. Denies any ideas or plans to hurt  himself or others. Denies auditory or visual hallucinations, and said "not now", but he did  have them in the past. Denies any paranoid or suspicious ideas. He could count money. He knew there were 4 quarters, 10 dimes, 20 nickels, 100 pennies in a dollar. Regarding judgment, for fire, he said he would yell, and he stated "that's all I can do, I can't do anything more." He wants to get better, and absolutely denies any ideas of hurting himself or others. The patient stated that he was diagnosed with major depressive disorder. However, according to information obtained from the chart, he was diagnosed with bipolar disorder and has been on Lithium. The patient has substance abuse, according to the chart, but the patient absolutely denies any such problem. History of suicide attempts in the past.  RECOMMEND:  Continue current medications. The patient reports that he does attend [place] mental health clinic [place], and he will keep up with follow up appointment after discharge.   ____________________________ Jannet MantisSurya K. Guss Bunde[place]alla, MD skc:mr D: 10/27/2013 20:20:24 ET T: 10/27/2013 21:30:02 ET JOB#: [id number]  cc: Monika SalkSurya K. Guss Bunde[place]alla, MD, <Dictator> Beau FannySURYA K Kayin Kettering MD ELECTRONICALLY SIGNED 10/28/2013 12:24

## 2015-01-12 NOTE — H&P (Signed)
PATIENT NAME:  [name], [name] MR#:  [id number] DATE OF BIRTH:  [birth year]  DATE OF ADMISSION:  01/04/2012  REFERRING PHYSICIAN: Dr. Shaune PollackLord    PRIMARY PHYSICIAN: In [place]    PRESENTING COMPLAINT: Brought in with reports of urgency, frequency, dysuria, and confusion.   HISTORY OF PRESENT ILLNESS: [name] is a 64 year old gentleman with history of diabetes, hypertension, probable sleep apnea, adult behavioral disorder who was brought in with complaints of frequency, urgency, burning that began to worsen and then finally development of confusion. The patient himself is not able to provide history. He is oriented to person only. The patient has received multiple medications as he was quite agitated when he initially arrived with Haldol, Ativan, and morphine. History is obtained from his daughter. The patient is visiting [place] from [place]. He had been complaining last night of the urgency, frequency, and burning but was not yet confused. His symptoms continued to worsen and became more fatigued this evening and then developed acute onset of altered mental status. Prior to that his daughter reports that he was in his usual state of health.   PAST MEDICAL HISTORY FROM PREVIOUS RECORDS:  1. Diabetes, insulin-dependent.  2. Hypertension.  3. Obesity.  4. Sleep apnea.  5. 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  6. Peripheral neuropathy.  7. Hyperlipidemia.  8. Gout.  9. 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  10. History of psychiatric hospitalizations and suicide attempt by overdose.    PAST SURGICAL HISTORY:  1. Cholecystectomy.  2. History of nephrolithiasis status post lithotripsy x2.  3. Esophageal dilation as above.   ALLERGIES: No known drug allergies.   MEDICATIONS: Unknown medications. Daughter only has  the pill box that was prepared by the patient's mother. [facility] uses [facility] in [place] but they do not know the address of the pharmacy. She has a NovoLog pen that he takes 5 units sub-Q t.i.d. and also he takes Levemir, unknown dose.   FAMILY HISTORY: Mother had diabetes. Father had diabetes, hypertension, heart disease, strokes. Brother died from pancreatitis. Sister died in a motor vehicle accident.   SOCIAL HISTORY: Lives in [place] with his mother. No prior history of tobacco, alcohol, or drug use. 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  REVIEW OF SYSTEMS: Unable to obtain due to the patient's altered mentation. Please refer to history of present illness.   PHYSICAL EXAMINATION:   VITAL SIGNS: Temperature 99.5, pulse 99, respiratory rate 18, blood pressure 132/68, sating at 100% on room air.   GENERAL: Lying in bed confused in no apparent distress.   HEENT: Normocephalic, atraumatic. Pupils equal, symmetric. Nares without discharge. He has dry mucous membranes.   NECK: Soft and supple. No adenopathy or JVP.   CARDIOVASCULAR: Non-tachy. No murmurs, rubs, or gallops.   LUNGS: Faint basilar crackles. No use of accessory muscles or increased respiratory effort.   ABDOMEN: Soft. Positive bowel sounds. No mass appreciated.   EXTREMITIES: No edema.   GU: Foley in place.   MUSCULOSKELETAL: No joint effusion.   SKIN: No ulcers.   NEUROLOGIC: The patient is not cooperating with neurological exam.   PSYCH: He is alert but not oriented, a little bit somnolent from his medications.   PERTINENT LABS AND STUDIES: Urinalysis with specific gravity of 1.009, blood 3+, pH 5, protein 30 mg/dL, nitrite negative, leukocyte esterase 3+, RBC 164 per high-power field,  WBC 396 per high-power field, 3+ bacteria.   WBC 9.2, hemoglobin 11.8, hematocrit 35.2, platelets 242, MCV 96, glucose 204, BUN 21, creatinine 1.82, sodium 139, potassium 4.9, chloride 102, carbon dioxide 24, calcium 9.2. LFTs within  normal limits. Total protein 7.7.   ASSESSMENT AND PLAN: [name] is a 64 year old gentleman with history of bipolar disorder, panic disorder, personality disorder, diabetes, hypertension, probable obstructive sleep apnea, gout, and nephrolithiasis presenting with urgency, frequency, dysuria, and confusion.  1. Altered mental status likely in the setting of urinary tract infection sepsis. Blood culture, urine culture pending. Continue ceftriaxone, Foley, I's and O's. Continue neuro checks. Additional work-up if no improvement with fluids and antibiotics.  2. Acute renal failure likely in the setting of urinary tract infection. Will send renal ultrasound given history nephrolithiasis to evaluate for possible hydronephrosis and outlet obstruction. As above, IV fluids, I's and O's, daily creatinine.  3. Adult behavior disorder. Unknown medications. Will be verified in the morning.  4. Diabetes. Sliding scale for now until verification of his insulin regimen.  5. Prophylaxis with aspirin, Lovenox, and Protonix.   The patient's contact is daughter, Baldwin Racicot, number [phone]. Again will need to verify location of pharmacy so medications can be verified.      TIME SPENT: Approximately 45 minutes spent on patient care.    ____________________________ Reuel Derby, MD ap:drc D: 01/04/2012 23:03:48 ET T: 01/05/2012 07:08:11 ET JOB#: [id number]  cc: Pearlean Brownie Lolitha Tortora, MD, <Dictator> Reuel Derby MD ELECTRONICALLY SIGNED 01/17/2012 0:26

## 2015-01-12 NOTE — Discharge Summary (Signed)
PATIENT NAME:  [name], [name] MR#:  [id number] DATE OF BIRTH:  1951-08-31  DATE OF ADMISSION:  01/04/2012 DATE OF DISCHARGE:  01/09/2012  CONSULTANT: Dr. Guss [place] from Psychiatry    CHIEF COMPLAINT: Confusion, urgency, frequency, dysuria.   DISCHARGE DIAGNOSES:  1. Altered mental status likely in the setting of urinary tract infection. 2. Urinary tract infection, resolved. 3. Hematuria. 4. Acute on chronic renal failure.  5. Diabetes.   SECONDARY DIAGNOSES:  1. Hypertension. 2. Obesity. 3. Sleep apnea. 4. Adult behavioral disorder including bipolar disorder.  5. Panic disorder. 6. Personality disorder, dependent and borderline features. 7. Peripheral neuropathy. 8. Hyperlipidemia.  9. Gout.  10. Gastroesophageal reflux disease.  11. History of psychiatric hospitalizations and suicidal attempt by overdose in the past.  12. Cholecystectomy. 13. History of nephrolithiasis status post lithotripsy x2. 14. History of previous UTIs. 15. 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   DISCHARGE MEDICATIONS:  1. Levemir 40 units every 12 hours.  2. Humalog sliding scale insulin as needed.  3. Simvastatin 20 mg daily.  4. Plavix 75 mg daily.  5. Benztropine 1 mg twice a day.  6. Bethanechol 25 mg 2 times a day.  7. Remeron 45 mg daily at bedtime.  8. Ziprasidone 80 mg 2 times a day.  9. Fluoxetine 20 mg daily.  10. Metoprolol tartrate 25 mg one-half tab 2 times a day.  11. Cephalexin 500 mg every 12 hours as needed for six days.   DIET: Low sodium, ADA diabetic diet.   ACTIVITY: As tolerated.   FOLLOW-UP: Please follow-up with your primary care physician, psychiatrist, and urologist within one week. CAT scan of the abdomen and pelvis in a month for follow-up of his recent CAT findings here.   DISPOSITION: Home.   HISTORY OF PRESENT ILLNESS: For full details, please see the history and physical dictated on 01/04/2012 by Dr. Margie EgePhichith. Briefly, this is a  [age] year old male who presented with the above chief complaint. He was agitated in the ER and had received morphine, Ativan, and Haldol. History was derived from his daughter. He was found with a urinary tract infection and altered mental status and admitted to the hospitalist service for further evaluation and management. He was started on ceftriaxone for the urinary tract infection.   SIGNIFICANT LABS AND IMAGING: Creatinine on arrival 1.82, by discharge had trended down to 1.36, sodium on arrival 139, BUN 21, glucose 204. LFTs AST 13, otherwise within normal limits. WBC 9.2, hemoglobin 11.8, hematocrit 35.7. Peak WBC of 17.8, last one being 10.9. Blood cultures from the 16th no growth to date. Urine cultures showed Escherichia coli. Urinalysis positive for 396 WBCs, 3+ bacteria, no nitrites, 3+ leukocyte esterase.   CT of abdomen and pelvis showing findings concerning for cystitis. Alternatively, infiltrating bladder wall mass cannot be excluded as clinically appropriate. As there are findings consistent with cystitis, repeat evaluation status post appropriate therapeutic regimen is recommended. Concentric wall thickening in the rectum raising concern for proctitis. A moderate amount of stool.   KUB, kidney, urethra, and bladder no acute changes are identified. There is a moderate amount of fecal material in the colon.   HOSPITAL COURSE: The patient was admitted to the hospitalist service and started on ceftriaxone based on a positive urinalysis. Blood and urine cultures were sent. The patient was found to have an Escherichia coli urinary tract infection. So far WBC has been trending down and he has had no fevers recently. He has had multiple days of  IV ceftriaxone and will be discharged with an additional six days of Keflex as Escherichia is sensitive to cephalosporins. The patient did have the urinary tract infection in the setting of being noncompliant with his in and out catheters. He has some history  of atonic bladder, nephrolithiasis, and urinary retention. He is supposed to do in and out caths per his urologist as an outpatient. At times he is noncompliant with this. The case was discussed with Dr. Evelene Croon per chart who recommended discontinuation of Foley catheter that the patient initially had placed. He did have some hematuria which has resolved after taking out the Foley catheter. CT of abdomen was done with results as mentioned above. The patient did have mild renal failure on arrival. This was acute on chronic. His baseline creatinine is about mid ones. By discharge it had stabilized with IV fluids. His metformin had been held and he will be discharged without the metformin. He is to continue his insulin as previous. He does have extensive psychiatric history and was seen by Dr. Guss Bunde from psychiatry here. He denies having any suicidal or homicidal ideation or taking extra pills to hurt himself. His psych medications were resumed and the patient is to follow-up with his psychiatrist as an outpatient. He did have some constipation and was started on a bowel regimen. He has had a bowel movement. His electrolytes were repleted p.r.n. The patient at this point will be discharged with outpatient follow-up as above.   TOTAL TIME SPENT: 40 minutes.   CODE STATUS: The patient is FULL CODE.   ____________________________ Krystal Eaton, MD sa:drc D: 01/09/2012 12:49:20 ET T: 01/09/2012 15:06:38 ET JOB#: [id number]  cc: Krystal Eaton, MD, <Dictator>  Krystal Eaton MD ELECTRONICALLY SIGNED 01/16/2012 19:28
